# Patient Record
Sex: Male | Born: 1940 | Race: Asian | Hispanic: No | Marital: Married | Smoking: Former smoker
Health system: Southern US, Community
[De-identification: ages and names within clinical notes are randomized; demographics above are authoritative.]

## PROBLEM LIST (undated history)

## (undated) DIAGNOSIS — C801 Malignant (primary) neoplasm, unspecified: Secondary | ICD-10-CM

## (undated) DIAGNOSIS — E785 Hyperlipidemia, unspecified: Secondary | ICD-10-CM

## (undated) DIAGNOSIS — Z87442 Personal history of urinary calculi: Secondary | ICD-10-CM

## (undated) DIAGNOSIS — N189 Chronic kidney disease, unspecified: Secondary | ICD-10-CM

## (undated) DIAGNOSIS — I499 Cardiac arrhythmia, unspecified: Secondary | ICD-10-CM

## (undated) DIAGNOSIS — I10 Essential (primary) hypertension: Secondary | ICD-10-CM

## (undated) DIAGNOSIS — C61 Malignant neoplasm of prostate: Secondary | ICD-10-CM

## (undated) DIAGNOSIS — I4891 Unspecified atrial fibrillation: Secondary | ICD-10-CM

## (undated) DIAGNOSIS — M109 Gout, unspecified: Secondary | ICD-10-CM

## (undated) HISTORY — PX: HEMORROIDECTOMY: SUR656

## (undated) HISTORY — PX: COLONOSCOPY: SHX174

## (undated) HISTORY — PX: INSERTION PROSTATE RADIATION SEED: SUR718

## (undated) HISTORY — PX: VASECTOMY: SHX75

## (undated) HISTORY — PX: LITHOTRIPSY: SUR834

## (undated) NOTE — *Deleted (*Deleted)
Physician Discharge Summary  Ross Mccann WUJ:[id number] DOB: 01-22-1941 DOA: 03/17/2020  PCP: Barbette Reichmann, MD  Admit date: 03/17/2020 Discharge date: 03/18/2020  Admitted From: *** Disposition:  ***  Recommendations for Outpatient Follow-up:  1. Follow up with PCP in 1-2 weeks 2. Please obtain BMP/CBC in one week 3. Please follow up with your PCP on the following pending results: Unresulted Labs (From admission, onward)          Start     Ordered   03/19/20 0500  CBC  Tomorrow morning,   STAT        03/18/20 0705   03/19/20 0500  Basic metabolic panel  Tomorrow morning,   STAT        03/18/20 0705   03/19/20 0500  Magnesium  Tomorrow morning,   STAT        03/18/20 0705           Home Health:***  Equipment/Devices:***   Discharge Condition:***  CODE STATUS:***  Diet recommendation: ***  Subjective:***  Brief/Interim Summary: ***  Discharge Diagnoses:  Principal Problem:   Severe aortic stenosis Active Problems:   Chronic atrial fibrillation (HCC)   Chronic diastolic CHF (congestive heart failure) (HCC)   Hypertension   Hyperlipidemia, unspecified   Chronic anticoagulation - on Eliquis for afib    Discharge Instructions   Allergies as of 03/18/2020   No Known Allergies   Med Rec must be completed prior to using this SMARTLINK***       No Known Allergies  Consultations: ***   Procedures/Studies: DG Chest 2 View  Result Date: 03/17/2020 CLINICAL DATA:  61 year old male with shortness of breath EXAM: CHEST - 2 VIEW COMPARISON:  Chest radiograph dated 06/10/2018. FINDINGS: Diffuse interstitial coarsening and mild chronic bronchitic changes. No focal consolidation, pleural effusion or pneumothorax. Top-normal cardiac silhouette. Atherosclerotic calcification of the aorta. No acute osseous pathology. IMPRESSION: No active cardiopulmonary disease. Electronically Signed   By: Elgie Collard M.D.   On: 03/17/2020 16:05      Discharge  Exam: Vitals:   03/18/20 0737 03/18/20 0919  BP: (!) 129/107 127/90  Pulse: 62 (!) 114  Resp: 15 20  Temp: (!) 97.3 F (36.3 C)   SpO2: 98% 96%   Vitals:   03/18/20 0530 03/18/20 0600 03/18/20 0737 03/18/20 0919  BP: (!) 118/93 (!) 111/99 (!) 129/107 127/90  Pulse: 64 (!) 30 62 (!) 114  Resp: 11 16 15 20   Temp:   (!) 97.3 F (36.3 C)   TempSrc:   Oral   SpO2: 97% 94% 98% 96%  Weight:      Height:        General: Pt is alert, awake, not in acute distress Cardiovascular: RRR, S1/S2 +, no rubs, no gallops Respiratory: CTA bilaterally, no wheezing, no rhonchi Abdominal: Soft, NT, ND, bowel sounds + Extremities: no edema, no cyanosis    The results of significant diagnostics from this hospitalization (including imaging, microbiology, ancillary and laboratory) are listed below for reference.     Microbiology: Recent Results (from the past 240 hour(s))  Resp Panel by RT-PCR (Flu A&B, Covid) Nasopharyngeal Swab     Status: None   Collection Time: 03/17/20  8:31 PM   Specimen: Nasopharyngeal Swab; Nasopharyngeal(NP) swabs in vial transport medium  Result Value Ref Range Status   SARS Coronavirus 2 by RT PCR NEGATIVE NEGATIVE Final    Comment: (NOTE) SARS-CoV-2 target nucleic acids are NOT DETECTED.  The SARS-CoV-2 RNA is generally detectable in upper respiratory  specimens during the acute phase of infection. The lowest concentration of SARS-CoV-2 viral copies this assay can detect is 138 copies/mL. A negative result does not preclude SARS-Cov-2 infection and should not be used as the sole basis for treatment or other patient management decisions. A negative result may occur with  improper specimen collection/handling, submission of specimen other than nasopharyngeal swab, presence of viral mutation(s) within the areas targeted by this assay, and inadequate number of viral copies(<138 copies/mL). A negative result must be combined with clinical observations, patient  history, and epidemiological information. The expected result is Negative.  Fact Sheet for Patients:  BloggerCourse.com  Fact Sheet for Healthcare Providers:  SeriousBroker.it  This test is no t yet approved or cleared by the [place] FDA and  has been authorized for detection and/or diagnosis of SARS-CoV-2 by FDA under an Emergency Use Authorization (EUA). This EUA will remain  in effect (meaning this test can be used) for the duration of the COVID-19 declaration under Section 564(b)(1) of the Act, 21 U.S.C.section 360bbb-3(b)(1), unless the authorization is terminated  or revoked sooner.       Influenza A by PCR NEGATIVE NEGATIVE Final   Influenza B by PCR NEGATIVE NEGATIVE Final    Comment: (NOTE) The Xpert Xpress SARS-CoV-2/FLU/RSV plus assay is intended as an aid in the diagnosis of influenza from Nasopharyngeal swab specimens and should not be used as a sole basis for treatment. Nasal washings and aspirates are unacceptable for Xpert Xpress SARS-CoV-2/FLU/RSV testing.  Fact Sheet for Patients: BloggerCourse.com  Fact Sheet for Healthcare Providers: SeriousBroker.it  This test is not yet approved or cleared by the [place] FDA and has been authorized for detection and/or diagnosis of SARS-CoV-2 by FDA under an Emergency Use Authorization (EUA). This EUA will remain in effect (meaning this test can be used) for the duration of the COVID-19 declaration under Section 564(b)(1) of the Act, 21 U.S.C. section 360bbb-3(b)(1), unless the authorization is terminated or revoked.  Performed at [facility], [address] [place], [place] [address]      Labs: BNP (last 3 results) Recent Labs    03/17/20 2031  BNP 2,997.3*   Basic Metabolic Panel: Recent Labs  Lab 03/17/20 1535 03/18/20 0452  NA 137 140  K 4.4 4.7  CL 103 105  CO2 23 24   GLUCOSE 118* 113*  BUN 47* 49*  CREATININE 1.69* 1.78*  CALCIUM 9.5 9.6   Liver Function Tests: Recent Labs  Lab 03/17/20 2031 03/18/20 0452  AST 46* 41  ALT 31 31  ALKPHOS 104 93  BILITOT 2.1* 2.0*  PROT 7.4 7.1  ALBUMIN 4.1 3.7   No results for input(s): LIPASE, AMYLASE in the last 168 hours. No results for input(s): AMMONIA in the last 168 hours. CBC: Recent Labs  Lab 03/17/20 1535 03/18/20 0452  WBC 6.6 6.8  NEUTROABS  --  4.4  HGB 14.0 13.1  HCT 45.8 41.0  MCV 68.9* 68.2*  PLT 187 168   Cardiac Enzymes: No results for input(s): CKTOTAL, CKMB, CKMBINDEX, TROPONINI in the last 168 hours. BNP: Invalid input(s): POCBNP CBG: No results for input(s): GLUCAP in the last 168 hours. D-Dimer Recent Labs    03/17/20 2031  DDIMER 2.10*   Hgb A1c No results for input(s): HGBA1C in the last 72 hours. Lipid Profile No results for input(s): CHOL, HDL, LDLCALC, TRIG, CHOLHDL, LDLDIRECT in the last 72 hours. Thyroid function studies No results for input(s): TSH, T4TOTAL, T3FREE, THYROIDAB in the last 72  hours.  Invalid input(s): FREET3 Anemia work up No results for input(s): VITAMINB12, FOLATE, FERRITIN, TIBC, IRON, RETICCTPCT in the last 72 hours. Urinalysis    Component Value Date/Time   COLORURINE YELLOW (A) 03/17/2020 1535   APPEARANCEUR HAZY (A) 03/17/2020 1535   LABSPEC 1.019 03/17/2020 1535   PHURINE 5.0 03/17/2020 1535   GLUCOSEU NEGATIVE 03/17/2020 1535   HGBUR LARGE (A) 03/17/2020 1535   BILIRUBINUR NEGATIVE 03/17/2020 1535   KETONESUR NEGATIVE 03/17/2020 1535   PROTEINUR >=300 (A) 03/17/2020 1535   NITRITE NEGATIVE 03/17/2020 1535   LEUKOCYTESUR NEGATIVE 03/17/2020 1535   Sepsis Labs Invalid input(s): PROCALCITONIN,  WBC,  LACTICIDVEN Microbiology Recent Results (from the past 240 hour(s))  Resp Panel by RT-PCR (Flu A&B, Covid) Nasopharyngeal Swab     Status: None   Collection Time: 03/17/20  8:31 PM   Specimen: Nasopharyngeal Swab;  Nasopharyngeal(NP) swabs in vial transport medium  Result Value Ref Range Status   SARS Coronavirus 2 by RT PCR NEGATIVE NEGATIVE Final    Comment: (NOTE) SARS-CoV-2 target nucleic acids are NOT DETECTED.  The SARS-CoV-2 RNA is generally detectable in upper respiratory specimens during the acute phase of infection. The lowest concentration of SARS-CoV-2 viral copies this assay can detect is 138 copies/mL. A negative result does not preclude SARS-Cov-2 infection and should not be used as the sole basis for treatment or other patient management decisions. A negative result may occur with  improper specimen collection/handling, submission of specimen other than nasopharyngeal swab, presence of viral mutation(s) within the areas targeted by this assay, and inadequate number of viral copies(<138 copies/mL). A negative result must be combined with clinical observations, patient history, and epidemiological information. The expected result is Negative.  Fact Sheet for Patients:  BloggerCourse.com  Fact Sheet for Healthcare Providers:  SeriousBroker.it  This test is no t yet approved or cleared by the [place] FDA and  has been authorized for detection and/or diagnosis of SARS-CoV-2 by FDA under an Emergency Use Authorization (EUA). This EUA will remain  in effect (meaning this test can be used) for the duration of the COVID-19 declaration under Section 564(b)(1) of the Act, 21 U.S.C.section 360bbb-3(b)(1), unless the authorization is terminated  or revoked sooner.       Influenza A by PCR NEGATIVE NEGATIVE Final   Influenza B by PCR NEGATIVE NEGATIVE Final    Comment: (NOTE) The Xpert Xpress SARS-CoV-2/FLU/RSV plus assay is intended as an aid in the diagnosis of influenza from Nasopharyngeal swab specimens and should not be used as a sole basis for treatment. Nasal washings and aspirates are unacceptable for Xpert Xpress  SARS-CoV-2/FLU/RSV testing.  Fact Sheet for Patients: BloggerCourse.com  Fact Sheet for Healthcare Providers: SeriousBroker.it  This test is not yet approved or cleared by the [place] FDA and has been authorized for detection and/or diagnosis of SARS-CoV-2 by FDA under an Emergency Use Authorization (EUA). This EUA will remain in effect (meaning this test can be used) for the duration of the COVID-19 declaration under Section 564(b)(1) of the Act, 21 U.S.C. section 360bbb-3(b)(1), unless the authorization is terminated or revoked.  Performed at [facility], [address], [place], [place] [address]      Time coordinating discharge: Over 30 minutes  SIGNED:   Hughie Closs, MD  [facility] 03/18/2020, 9:56 AM  If 7PM-7AM, please contact night-coverage [email or web address]

---

## 2005-12-31 ENCOUNTER — Emergency Department: Payer: Self-pay | Admitting: Emergency Medicine

## 2006-01-03 ENCOUNTER — Ambulatory Visit: Payer: Self-pay | Admitting: Urology

## 2006-01-09 ENCOUNTER — Emergency Department: Payer: Self-pay | Admitting: Emergency Medicine

## 2006-01-14 ENCOUNTER — Emergency Department: Payer: Self-pay | Admitting: Emergency Medicine

## 2006-01-15 ENCOUNTER — Emergency Department: Payer: Self-pay | Admitting: Emergency Medicine

## 2006-10-18 ENCOUNTER — Other Ambulatory Visit: Payer: Self-pay

## 2006-10-18 ENCOUNTER — Ambulatory Visit: Payer: Self-pay | Admitting: Urology

## 2006-10-24 ENCOUNTER — Ambulatory Visit: Payer: Self-pay | Admitting: Urology

## 2007-06-05 ENCOUNTER — Ambulatory Visit: Payer: Self-pay | Admitting: Internal Medicine

## 2008-10-12 ENCOUNTER — Emergency Department: Payer: Self-pay | Admitting: Emergency Medicine

## 2009-03-17 ENCOUNTER — Emergency Department: Payer: Self-pay | Admitting: Emergency Medicine

## 2009-03-26 ENCOUNTER — Ambulatory Visit: Payer: Self-pay | Admitting: Urology

## 2009-03-31 ENCOUNTER — Ambulatory Visit: Payer: Self-pay | Admitting: Urology

## 2012-12-21 ENCOUNTER — Ambulatory Visit: Payer: Self-pay | Admitting: Urology

## 2012-12-22 ENCOUNTER — Ambulatory Visit: Payer: Self-pay | Admitting: Urology

## 2012-12-27 ENCOUNTER — Ambulatory Visit: Payer: Self-pay | Admitting: Hematology and Oncology

## 2012-12-27 LAB — COMPREHENSIVE METABOLIC PANEL
Albumin: 3.9 g/dL (ref 3.4–5.0)
Alkaline Phosphatase: 88 U/L (ref 50–136)
Anion Gap: 6 — ABNORMAL LOW (ref 7–16)
BUN: 23 mg/dL — ABNORMAL HIGH (ref 7–18)
Calcium, Total: 9.7 mg/dL (ref 8.5–10.1)
Co2: 30 mmol/L (ref 21–32)
Creatinine: 1.32 mg/dL — ABNORMAL HIGH (ref 0.60–1.30)
Osmolality: 279 (ref 275–301)
SGPT (ALT): 48 U/L (ref 12–78)
Sodium: 138 mmol/L (ref 136–145)

## 2012-12-27 LAB — CBC CANCER CENTER
Eosinophil %: 2.4 %
HGB: 13.5 g/dL (ref 13.0–18.0)
Lymphocyte #: 1.9 x10 3/mm (ref 1.0–3.6)
Lymphocyte %: 39.7 %
MCV: 67 fL — ABNORMAL LOW (ref 80–100)
RBC: 6.41 10*6/uL — ABNORMAL HIGH (ref 4.40–5.90)
RDW: 16.3 % — ABNORMAL HIGH (ref 11.5–14.5)
WBC: 4.9 x10 3/mm (ref 3.8–10.6)

## 2012-12-29 LAB — PROT IMMUNOELECTROPHORES(ARMC)

## 2013-01-17 ENCOUNTER — Ambulatory Visit: Payer: Self-pay | Admitting: Hematology and Oncology

## 2013-02-02 LAB — PSA: PSA: 1.2 ng/mL (ref 0.0–4.0)

## 2013-02-17 ENCOUNTER — Ambulatory Visit: Payer: Self-pay | Admitting: Hematology and Oncology

## 2013-04-26 ENCOUNTER — Ambulatory Visit: Payer: Self-pay | Admitting: Hematology and Oncology

## 2013-04-26 LAB — CBC CANCER CENTER
BASOS ABS: 0 x10 3/mm (ref 0.0–0.1)
Basophil %: 0.5 %
Eosinophil #: 0.1 x10 3/mm (ref 0.0–0.7)
Eosinophil %: 1.5 %
HCT: 42.1 % (ref 40.0–52.0)
HGB: 13 g/dL (ref 13.0–18.0)
LYMPHS PCT: 29.6 %
Lymphocyte #: 2.1 x10 3/mm (ref 1.0–3.6)
MCH: 20.8 pg — ABNORMAL LOW (ref 26.0–34.0)
MCHC: 30.8 g/dL — ABNORMAL LOW (ref 32.0–36.0)
MCV: 68 fL — AB (ref 80–100)
MONOS PCT: 7.5 %
Monocyte #: 0.5 x10 3/mm (ref 0.2–1.0)
NEUTROS PCT: 60.9 %
Neutrophil #: 4.4 x10 3/mm (ref 1.4–6.5)
PLATELETS: 197 x10 3/mm (ref 150–440)
RBC: 6.23 10*6/uL — AB (ref 4.40–5.90)
RDW: 16.4 % — AB (ref 11.5–14.5)
WBC: 7.2 x10 3/mm (ref 3.8–10.6)

## 2013-04-26 LAB — COMPREHENSIVE METABOLIC PANEL
ANION GAP: 8 (ref 7–16)
AST: 25 U/L (ref 15–37)
Albumin: 4 g/dL (ref 3.4–5.0)
Alkaline Phosphatase: 78 U/L
BILIRUBIN TOTAL: 0.5 mg/dL (ref 0.2–1.0)
BUN: 21 mg/dL — ABNORMAL HIGH (ref 7–18)
CHLORIDE: 101 mmol/L (ref 98–107)
Calcium, Total: 9.3 mg/dL (ref 8.5–10.1)
Co2: 28 mmol/L (ref 21–32)
Creatinine: 1.49 mg/dL — ABNORMAL HIGH (ref 0.60–1.30)
EGFR (African American): 53 — ABNORMAL LOW
GFR CALC NON AF AMER: 46 — AB
GLUCOSE: 105 mg/dL — AB (ref 65–99)
Osmolality: 277 (ref 275–301)
Potassium: 4.4 mmol/L (ref 3.5–5.1)
SGPT (ALT): 30 U/L (ref 12–78)
SODIUM: 137 mmol/L (ref 136–145)
Total Protein: 7.5 g/dL (ref 6.4–8.2)

## 2013-04-28 LAB — PSA: PSA: 1.6 ng/mL (ref 0.0–4.0)

## 2013-05-20 ENCOUNTER — Ambulatory Visit: Payer: Self-pay | Admitting: Hematology and Oncology

## 2013-08-23 ENCOUNTER — Ambulatory Visit: Payer: Self-pay | Admitting: Hematology and Oncology

## 2013-08-24 LAB — COMPREHENSIVE METABOLIC PANEL
ANION GAP: 5 — AB (ref 7–16)
AST: 43 U/L — AB (ref 15–37)
Albumin: 3.9 g/dL (ref 3.4–5.0)
Alkaline Phosphatase: 85 U/L
BILIRUBIN TOTAL: 0.6 mg/dL (ref 0.2–1.0)
BUN: 37 mg/dL — AB (ref 7–18)
CHLORIDE: 105 mmol/L (ref 98–107)
Calcium, Total: 9 mg/dL (ref 8.5–10.1)
Co2: 26 mmol/L (ref 21–32)
Creatinine: 1.43 mg/dL — ABNORMAL HIGH (ref 0.60–1.30)
EGFR (African American): 55 — ABNORMAL LOW
EGFR (Non-African Amer.): 48 — ABNORMAL LOW
Glucose: 122 mg/dL — ABNORMAL HIGH (ref 65–99)
OSMOLALITY: 282 (ref 275–301)
Potassium: 4.1 mmol/L (ref 3.5–5.1)
SGPT (ALT): 35 U/L (ref 12–78)
SODIUM: 136 mmol/L (ref 136–145)
Total Protein: 7.6 g/dL (ref 6.4–8.2)

## 2013-08-24 LAB — CBC CANCER CENTER
BASOS ABS: 0.1 x10 3/mm (ref 0.0–0.1)
Basophil %: 0.7 %
Eosinophil #: 0.1 x10 3/mm (ref 0.0–0.7)
Eosinophil %: 0.9 %
HCT: 41.4 % (ref 40.0–52.0)
HGB: 13 g/dL (ref 13.0–18.0)
LYMPHS ABS: 1.9 x10 3/mm (ref 1.0–3.6)
LYMPHS PCT: 24.2 %
MCH: 21.3 pg — ABNORMAL LOW (ref 26.0–34.0)
MCHC: 31.3 g/dL — ABNORMAL LOW (ref 32.0–36.0)
MCV: 68 fL — ABNORMAL LOW (ref 80–100)
Monocyte #: 0.5 x10 3/mm (ref 0.2–1.0)
Monocyte %: 6.7 %
NEUTROS PCT: 67.5 %
Neutrophil #: 5.4 x10 3/mm (ref 1.4–6.5)
PLATELETS: 168 x10 3/mm (ref 150–440)
RBC: 6.09 10*6/uL — ABNORMAL HIGH (ref 4.40–5.90)
RDW: 15.8 % — ABNORMAL HIGH (ref 11.5–14.5)
WBC: 7.9 x10 3/mm (ref 3.8–10.6)

## 2013-08-28 LAB — PSA: PSA: 1.9 ng/mL (ref 0.0–4.0)

## 2013-09-17 ENCOUNTER — Ambulatory Visit: Payer: Self-pay | Admitting: Hematology and Oncology

## 2015-08-07 ENCOUNTER — Other Ambulatory Visit: Payer: Self-pay | Admitting: Urology

## 2015-08-07 DIAGNOSIS — C61 Malignant neoplasm of prostate: Secondary | ICD-10-CM

## 2015-08-19 ENCOUNTER — Ambulatory Visit
Admission: RE | Admit: 2015-08-19 | Discharge: 2015-08-19 | Disposition: A | Discharge status: Home / Self Care | Payer: Medicare Other | Source: Ambulatory Visit | Attending: Urology | Admitting: Urology

## 2015-08-19 ENCOUNTER — Encounter
Admission: RE | Admit: 2015-08-19 | Discharge: 2015-08-19 | Disposition: A | Discharge status: Home / Self Care | Payer: Medicare Other | Source: Ambulatory Visit | Attending: Urology | Admitting: Urology

## 2015-08-19 DIAGNOSIS — C61 Malignant neoplasm of prostate: Secondary | ICD-10-CM | POA: Diagnosis not present

## 2015-08-19 DIAGNOSIS — I2510 Atherosclerotic heart disease of native coronary artery without angina pectoris: Secondary | ICD-10-CM | POA: Diagnosis not present

## 2015-08-19 DIAGNOSIS — R937 Abnormal findings on diagnostic imaging of other parts of musculoskeletal system: Secondary | ICD-10-CM | POA: Insufficient documentation

## 2015-08-19 DIAGNOSIS — I7090 Unspecified atherosclerosis: Secondary | ICD-10-CM | POA: Diagnosis not present

## 2015-08-19 DIAGNOSIS — R972 Elevated prostate specific antigen [PSA]: Secondary | ICD-10-CM | POA: Insufficient documentation

## 2015-08-19 DIAGNOSIS — N210 Calculus in bladder: Secondary | ICD-10-CM | POA: Insufficient documentation

## 2015-08-19 HISTORY — DX: Malignant (primary) neoplasm, unspecified: C80.1

## 2015-08-19 LAB — POCT I-STAT CREATININE: Creatinine, Ser: 1.1 mg/dL (ref 0.61–1.24)

## 2015-08-19 MED ORDER — IOPAMIDOL (ISOVUE-300) INJECTION 61%
100.0000 mL | Freq: Once | INTRAVENOUS | Status: AC | PRN
  Administered 2015-08-19: 100 mL via INTRAVENOUS

## 2015-08-19 MED ORDER — TECHNETIUM TC 99M MEDRONATE IV KIT
25.0000 | PACK | Freq: Once | INTRAVENOUS | Status: AC | PRN
  Administered 2015-08-19: 23.42 via INTRAVENOUS

## 2015-08-25 ENCOUNTER — Other Ambulatory Visit: Payer: Self-pay | Admitting: Urology

## 2015-08-25 ENCOUNTER — Other Ambulatory Visit: Payer: Self-pay | Admitting: Orthopedic Surgery

## 2015-08-25 DIAGNOSIS — C419 Malignant neoplasm of bone and articular cartilage, unspecified: Secondary | ICD-10-CM

## 2015-08-27 ENCOUNTER — Ambulatory Visit
Admission: RE | Admit: 2015-08-27 | Discharge: 2015-08-27 | Disposition: A | Discharge status: Home / Self Care | Payer: Medicare Other | Source: Ambulatory Visit | Attending: Urology | Admitting: Urology

## 2015-08-27 DIAGNOSIS — C419 Malignant neoplasm of bone and articular cartilage, unspecified: Secondary | ICD-10-CM

## 2015-08-27 DIAGNOSIS — C61 Malignant neoplasm of prostate: Secondary | ICD-10-CM | POA: Diagnosis present

## 2015-09-04 DIAGNOSIS — I482 Chronic atrial fibrillation, unspecified: Secondary | ICD-10-CM | POA: Diagnosis present

## 2015-09-18 ENCOUNTER — Encounter: Payer: Self-pay | Admitting: Emergency Medicine

## 2015-09-18 ENCOUNTER — Emergency Department
Admission: EM | Admit: 2015-09-18 | Discharge: 2015-09-18 | Disposition: A | Discharge status: Home / Self Care | Payer: Medicare Other | Attending: Emergency Medicine | Admitting: Emergency Medicine

## 2015-09-18 DIAGNOSIS — Z87891 Personal history of nicotine dependence: Secondary | ICD-10-CM | POA: Insufficient documentation

## 2015-09-18 DIAGNOSIS — E785 Hyperlipidemia, unspecified: Secondary | ICD-10-CM | POA: Insufficient documentation

## 2015-09-18 DIAGNOSIS — I10 Essential (primary) hypertension: Secondary | ICD-10-CM | POA: Insufficient documentation

## 2015-09-18 DIAGNOSIS — R339 Retention of urine, unspecified: Secondary | ICD-10-CM | POA: Diagnosis not present

## 2015-09-18 DIAGNOSIS — Z8546 Personal history of malignant neoplasm of prostate: Secondary | ICD-10-CM | POA: Diagnosis not present

## 2015-09-18 DIAGNOSIS — I4891 Unspecified atrial fibrillation: Secondary | ICD-10-CM | POA: Insufficient documentation

## 2015-09-18 DIAGNOSIS — R338 Other retention of urine: Secondary | ICD-10-CM

## 2015-09-18 HISTORY — DX: Malignant neoplasm of prostate: C61

## 2015-09-18 HISTORY — DX: Hyperlipidemia, unspecified: E78.5

## 2015-09-18 HISTORY — DX: Unspecified atrial fibrillation: I48.91

## 2015-09-18 HISTORY — DX: Essential (primary) hypertension: I10

## 2015-09-18 LAB — URINALYSIS COMPLETE WITH MICROSCOPIC (ARMC ONLY)
BILIRUBIN URINE: NEGATIVE
Bacteria, UA: NONE SEEN
GLUCOSE, UA: NEGATIVE mg/dL
Ketones, ur: NEGATIVE mg/dL
Leukocytes, UA: NEGATIVE
Nitrite: NEGATIVE
Protein, ur: NEGATIVE mg/dL
RBC / HPF: NONE SEEN RBC/hpf (ref 0–5)
SPECIFIC GRAVITY, URINE: 1.003 — AB (ref 1.005–1.030)
Squamous Epithelial / LPF: NONE SEEN
pH: 6 (ref 5.0–8.0)

## 2015-09-18 MED ORDER — LIDOCAINE HCL 2 % EX GEL
CUTANEOUS | Status: AC
  Filled 2015-09-18: qty 10

## 2015-09-18 NOTE — ED Notes (Signed)
Pt discharged home after verbalizing understanding of discharge instructions; nad noted. 

## 2015-09-18 NOTE — ED Notes (Signed)
Pt presents with urinary retention since this morning. He had normal urinary function yesterday, but has been unable to urinate other than very small amounts today. States he has a constant urge to urinate and then can't. Pt alert & oriented; denies pain.

## 2015-09-18 NOTE — Discharge Instructions (Signed)
You were seen in the Emergency Department (ED) for urinary retention.  We placed a Foley catheter to help your bladder drain.  Please read through the included information and follow up with your doctor as recommended in these papers; your doctor will see you in clinic and help you determine when it is time to have the catheter removed.  If you stop producing urine in the bag or if you develop other symptoms that concern you, such as fever, chills, persistent vomiting, or severe abdominal pain, please return immediately to the Emergency Department.     Acute Urinary Retention, Male Acute urinary retention is the temporary inability to urinate. This is a common problem in older men. As men age their prostates become larger and block the flow of urine from the bladder. This is usually a problem that has come on gradually.  HOME CARE INSTRUCTIONS If you are sent home with a Foley catheter and a drainage system, you will need to discuss the best course of action with your health care provider. While the catheter is in, maintain a good intake of fluids. Keep the drainage bag emptied and lower than your catheter. This is so that contaminated urine will not flow back into your bladder, which could lead to a urinary tract infection. There are two main types of drainage bags. One is a large bag that usually is used at night. It has a good capacity that will allow you to sleep through the night without having to empty it. The second type is called a leg bag. It has a smaller capacity, so it needs to be emptied more frequently. However, the main advantage is that it can be attached by a leg strap and can go underneath your clothing, allowing you the freedom to move about or leave your home. Only take over-the-counter or prescription medicines for pain, discomfort, or fever as directed by your health care provider.  SEEK MEDICAL CARE IF:  You develop a low-grade fever.  You experience spasms or leakage of urine  with the spasms. SEEK IMMEDIATE MEDICAL CARE IF:   You develop chills or fever.  Your catheter stops draining urine.  Your catheter falls out.  You start to develop increased bleeding that does not respond to rest and increased fluid intake. MAKE SURE YOU:  Understand these instructions.  Will watch your condition.  Will get help right away if you are not doing well or get worse.   This information is not intended to replace advice given to you by your health care provider. Make sure you discuss any questions you have with your health care provider.   Document Released: 07/12/2000 Document Revised: 08/20/2014 Document Reviewed: 09/14/2012 Elsevier Interactive Patient Education 2016 [place], Adult  A Foley catheter is a soft, flexible tube that is placed into the bladder to drain urine. A Foley catheter may be inserted if:  You leak urine or are not able to control when you urinate (urinary incontinence).  You are not able to urinate when you need to (urinary retention).  You had prostate surgery or surgery on the genitals.  You have certain medical conditions, such as multiple sclerosis, dementia, or a spinal cord injury. If you are going home with a Foley catheter in place, follow the instructions below.  TAKING CARE OF THE CATHETER  1. Wash your hands with soap and water. 2. Using mild soap and warm water on a clean washcloth: Clean the area on your body closest  to the catheter insertion site using a circular motion, moving away from the catheter. Never wipe toward the catheter because this could sweep bacteria up into the urethra and cause infection.  Remove all traces of soap. Pat the area dry with a clean towel. For males, reposition the foreskin. 3. Attach the catheter to your leg so there is no tension on the catheter. Use adhesive tape or a leg strap. If you are using adhesive tape, remove any sticky residue left behind by the previous tape you  used. 4. Keep the drainage bag below the level of the bladder, but keep it off the floor. 5. Check throughout the day to be sure the catheter is working and urine is draining freely. Make sure the tubing does not become kinked. 6. Do not pull on the catheter or try to remove it. Pulling could damage internal tissues. TAKING CARE OF THE DRAINAGE BAGS  You will be given two drainage bags to take home. One is a large overnight drainage bag, and the other is a smaller leg bag that fits underneath clothing. You may wear the overnight bag at any time, but you should never wear the smaller leg bag at night. Follow the instructions below for how to empty, change, and clean your drainage bags.  Emptying the Drainage Bag  You must empty your drainage bag when it is - full or at least 2-3 times a day.  1. Wash your hands with soap and water. 2. Keep the drainage bag below your hips, below the level of your bladder. This stops urine from going back into the tubing and into your bladder. 3. Hold the dirty bag over the toilet or a clean container. 4. Open the pour spout at the bottom of the bag and empty the urine into the toilet or container. Do not let the pour spout touch the toilet, container, or any other surface. Doing so can place bacteria on the bag, which can cause an infection. 5. Clean the pour spout with a gauze pad or cotton ball that has rubbing alcohol on it. 6. Close the pour spout. 7. Attach the bag to your leg with adhesive tape or a leg strap. 8. Wash your hands well. Changing the Drainage Bag  Change your drainage bag once a month or sooner if it starts to smell bad or look dirty. Below are steps to follow when changing the drainage bag.  1. Wash your hands with soap and water. 2. Pinch off the rubber catheter so that urine does not spill out. 3. Disconnect the catheter tube from the drainage tube at the connection valve. Do not let the tubes touch any surface. 4. Clean the end of the  catheter tube with an alcohol wipe. Use a different alcohol wipe to clean the end of the drainage tube. 5. Connect the catheter tube to the drainage tube of the clean drainage bag. 6. Attach the new bag to the leg with adhesive tape or a leg strap. Avoid attaching the new bag too tightly. 7. Wash your hands well. Cleaning the Drainage Bag  1. Wash your hands with soap and water. 2. Wash the bag in warm, soapy water. 3. Rinse the bag thoroughly with warm water. 4. Fill the bag with a solution of white vinegar and water (1 cup vinegar to 1 qt warm water [.2 L vinegar to 1 L warm water]). Close the bag and soak it for 30 minutes in the solution. 5. Rinse the bag with warm water.  6. Hang the bag to dry with the pour spout open and hanging downward. 7. Store the clean bag (once it is dry) in a clean plastic bag. 8. Wash your hands well. PREVENTING INFECTION  Wash your hands before and after handling your catheter.  Take showers daily and wash the area where the catheter enters your body. Do not take baths. Replace wet leg straps with dry ones, if this applies.  Do not use powders, sprays, or lotions on the genital area. Only use creams, lotions, or ointments as directed by your caregiver.  For females, wipe from front to back after each bowel movement.  Drink enough fluids to keep your urine clear or pale yellow unless you have a fluid restriction.  Do not let the drainage bag or tubing touch or lie on the floor.  Wear cotton underwear to absorb moisture and to keep your skin drier. SEEK MEDICAL CARE IF:  Your urine is cloudy or smells unusually bad.  Your catheter becomes clogged.  You are not draining urine into the bag or your bladder feels full.  Your catheter starts to leak. SEEK IMMEDIATE MEDICAL CARE IF:  You have pain, swelling, redness, or pus where the catheter enters the body.  You have pain in the abdomen, legs, lower back, or bladder.  You have a fever.  You see blood fill the  catheter, or your urine is pink or red.  You have nausea, vomiting, or chills.  Your catheter gets pulled out. MAKE SURE YOU:  Understand these instructions.  Will watch your condition.  Will get help right away if you are not doing well or get worse. This information is not intended to replace advice given to you by your health care provider. Make sure you discuss any questions you have with your health care provider.  Document Released: 04/05/2005 Document Revised: 08/20/2013 Document Reviewed: 03/27/2012  Elsevier Interactive Patient Education [employer].

## 2015-09-18 NOTE — ED Notes (Signed)
Pt woke up this morning with sensation to urinate but states only a few drops came out.  Pt states he drank multiple glasses of water, continues to have urge to urinate frequently, but "nothing comes out". Pt states he has history of prostate cancer.

## 2015-09-18 NOTE — ED Provider Notes (Signed)
[facility] Emergency Department Provider Note  ____________________________________________  Time seen: Approximately 2:39 PM  I have reviewed the triage vital signs and the nursing notes.   HISTORY  Chief Complaint Urinary Retention    HPI Ross Mccann is a 75 y.o. male with history of prostate cancer and who is followed by Dr. Yves Dill with urology.  He presents to the emergency department complaining of urinary retention.  He reports that he is not been able to urinate since this morning.  His abdomen feels distended and he is feeling a dull aching pain that is severe throughout his lower abdomen.  He tries to urinate and only a few drops come out.  He has never had this issue in the past.  The symptoms are severe and nothing is helping.  It feels worse when he is moving around.  He denies fever/chills, chest pain, shortness of breath, nausea, vomiting, diarrhea.  He reports that he saw Dr. Yves Dill about 3-4 weeks ago and had some imaging performed to check on the status of the cancer as well as a prostate biopsy.  Dr. Eliberto Ivory told him that the biopsy was completely normal so there was no chance that he has cancer anymore, but he was never informed about the results of his imaging studies which included a bone scan and specific images of his head.   Past Medical History  Diagnosis Date  . Cancer [facility])     prostate  . Prostate cancer ([name])   . Hypertension   . Hyperlipidemia   . Atrial fibrillation ([place])     There are no active problems to display for this patient.   Past Surgical History  Procedure Laterality Date  . Vasectomy      No current outpatient prescriptions on file.  Allergies Review of patient's allergies indicates no known allergies.  No family history on file.  Social History Social History  Substance Use Topics  . Smoking status: Former Smoker    Quit date: 09/18/1978  . Smokeless tobacco: None  . Alcohol Use: 4.2 oz/week      7 Cans of beer per week    Review of Systems Constitutional: No fever/chills Eyes: No visual changes. ENT: No sore throat. Cardiovascular: Denies chest pain. Respiratory: Denies shortness of breath. Gastrointestinal: Lower abdominal swelling and pain due to urinary retention.  No nausea, no vomiting.  No diarrhea.  No constipation. Genitourinary: Inability to urinate with abdominal swelling Musculoskeletal: Negative for back pain. Skin: Negative for rash. Neurological: Negative for headaches, focal weakness or numbness.  10-point ROS otherwise negative.  ____________________________________________   PHYSICAL EXAM:  VITAL SIGNS: ED Triage Vitals  Enc Vitals Group     BP 09/18/15 1340 169/79 mmHg     Pulse Rate 09/18/15 1340 71     Resp 09/18/15 1340 16     Temp 09/18/15 1340 97.6 F (36.4 C)     Temp Source 09/18/15 1340 Oral     SpO2 09/18/15 1340 99 %     Weight 09/18/15 1340 154 lb (69.854 kg)     Height 09/18/15 1340 5\' 7"  (1.702 m)     Head Cir --      Peak Flow --      Pain Score --      Pain Loc --      Pain Edu? --      Excl. in [place]? --     Constitutional: Alert and oriented. Well appearing and in no acute distress. Eyes:  Conjunctivae are normal. PERRL. EOMI. Head: Atraumatic. Nose: No congestion/rhinnorhea. Mouth/Throat: Mucous membranes are moist.  Oropharynx non-erythematous. Neck: No stridor.  No meningeal signs.   Cardiovascular: Normal rate, regular rhythm. Good peripheral circulation. Grossly normal heart sounds.   Respiratory: Normal respiratory effort.  No retractions. Lungs CTAB. Gastrointestinal: Soft and Nontender (I examined the patient after a Foley catheter was placed and more than 700 mL of urine were drained). Musculoskeletal: No lower extremity tenderness nor edema. No gross deformities of extremities. Neurologic:  Normal speech and language. No gross focal neurologic deficits are appreciated.  Skin:  Skin is warm, dry and intact. No  rash noted. Psychiatric: Mood and affect are normal. Speech and behavior are normal.  ____________________________________________   LABS (all labs ordered are listed, but only abnormal results are displayed)  Labs Reviewed  URINALYSIS COMPLETEWITH MICROSCOPIC ([place]) - Abnormal; Notable for the following:    Color, Urine COLORLESS (*)    APPearance CLEAR (*)    Specific Gravity, Urine 1.003 (*)    Hgb urine dipstick 2+ (*)    All other components within normal limits  URINE CULTURE   ____________________________________________  EKG  None ____________________________________________  RADIOLOGY   No results found.  ____________________________________________   PROCEDURES  Procedure(s) performed: None  Critical Care performed: No ____________________________________________   INITIAL IMPRESSION / ASSESSMENT AND PLAN / ED COURSE  Pertinent labs & imaging results that were available during my care of the patient were reviewed by me and considered in my medical decision making (see chart for details).  The patient is in no distress at all and completely pain-free after placing the Foley catheter and draining about 700 mL of urine.  He reportedly recently started on Eliquis by his cardiologist but there is no evidence of gross hematuria.  There is no evidence of urinary tract infection.  I explained to him about leaving the Foley catheter in place with a leg bag and following up with Dr. Yves Dill within a week.  Other than the urinary retention, the patient is completely asymptomatic with normal vital signs.  Of note, I reviewed the images performed about 3 weeks ago, and they are concerning for evolving metastatic cancer.  However, given that Dr. Yves Dill knows the patient, knows the results of his biopsy, and is managing him, I did not feel it was appropriate for me to possibly inaccurately or inappropriately tell him that he may have metastatic cancer given that the  results are still preliminary from his bone imaging.  I did, however, encouraged him to follow up as soon as possible and ask Dr. Yves Dill about the results.  I also sent a message through [facility] to Dr. Yves Dill to let him know that the patient had been in the emergency department, needs follow-up for urinary retention, and also needs follow up about his imaging.     ____________________________________________  FINAL CLINICAL IMPRESSION(S) / ED DIAGNOSES  Final diagnoses:  Acute urinary retention  History of prostate cancer     MEDICATIONS GIVEN DURING THIS VISIT:  Medications  lidocaine (XYLOCAINE) 2 % jelly (not administered)     NEW OUTPATIENT MEDICATIONS STARTED DURING THIS VISIT:  New Prescriptions   No medications on file      Note:  This document was prepared using Dragon voice recognition software and may include unintentional dictation errors.   Hinda Kehr, MD 09/18/15 [phone]

## 2015-09-19 ENCOUNTER — Emergency Department
Admission: EM | Admit: 2015-09-19 | Discharge: 2015-09-19 | Disposition: A | Discharge status: Home / Self Care | Payer: Medicare Other | Attending: Emergency Medicine | Admitting: Emergency Medicine

## 2015-09-19 ENCOUNTER — Encounter: Payer: Self-pay | Admitting: Emergency Medicine

## 2015-09-19 DIAGNOSIS — T83038A Leakage of other indwelling urethral catheter, initial encounter: Secondary | ICD-10-CM | POA: Diagnosis present

## 2015-09-19 DIAGNOSIS — I4891 Unspecified atrial fibrillation: Secondary | ICD-10-CM | POA: Insufficient documentation

## 2015-09-19 DIAGNOSIS — Z87891 Personal history of nicotine dependence: Secondary | ICD-10-CM | POA: Diagnosis not present

## 2015-09-19 DIAGNOSIS — I10 Essential (primary) hypertension: Secondary | ICD-10-CM | POA: Insufficient documentation

## 2015-09-19 DIAGNOSIS — Y829 Unspecified medical devices associated with adverse incidents: Secondary | ICD-10-CM | POA: Diagnosis not present

## 2015-09-19 DIAGNOSIS — Z8546 Personal history of malignant neoplasm of prostate: Secondary | ICD-10-CM | POA: Diagnosis not present

## 2015-09-19 DIAGNOSIS — T839XXA Unspecified complication of genitourinary prosthetic device, implant and graft, initial encounter: Secondary | ICD-10-CM

## 2015-09-19 DIAGNOSIS — E785 Hyperlipidemia, unspecified: Secondary | ICD-10-CM | POA: Insufficient documentation

## 2015-09-19 NOTE — ED Notes (Signed)
Discussed discharge instructions and follow-up care with patient. No questions or concerns at this time. Pt stable at discharge.  

## 2015-09-19 NOTE — ED Notes (Signed)
Pt presents to ED with reports of catheter drainage problems. Pt states catheter has stopped draining urine today and is painful. Pt states catheter was placed yesterday here in the ED.

## 2015-09-19 NOTE — ED Notes (Signed)
Offered to change catheter; explained to patient that we do not have a larger size catheter than a 5F which is what he has currently.  Pt declines offer at this time; stating it not leaked since being flushed and that he plans to see his urologist next week.  MD notified.

## 2015-09-19 NOTE — ED Provider Notes (Addendum)
[facility] Emergency Department Provider Note  ____________________________________________   I have reviewed the triage vital signs and the nursing notes.   HISTORY  Chief Complaint Catheter leaking     HPI Ross Mccann is a 75 y.o. male who had a catheter placed yesterday. History of prostate cancer. Has had multiple catheters placed in the past. Does follow closely with Dr. Eliberto Ivory. Has had actually no pain he states but he has a sensation that he needs to urinate sometimes he has a little bit of leakage around the catheter itself. He states the catheter is still draining quite well actually now that he has been watching it. There is concern that perhaps it is not the right size catheter. He does not have any fever or flank pain vomiting or any other symptoms.     Past Medical History  Diagnosis Date  . Cancer [facility])     prostate  . Prostate cancer ([place])   . Hypertension   . Hyperlipidemia   . Atrial fibrillation ([place])     There are no active problems to display for this patient.   Past Surgical History  Procedure Laterality Date  . Vasectomy      No current outpatient prescriptions on file.  Allergies Review of patient's allergies indicates no known allergies.  No family history on file.  Social History Social History  Substance Use Topics  . Smoking status: Former Smoker    Quit date: 09/18/1978  . Smokeless tobacco: None  . Alcohol Use: 4.2 oz/week    7 Cans of beer per week    Review of Systems Constitutional: No fever/chills Eyes: No visual changes. ENT: No sore throat. No stiff neck no neck pain Cardiovascular: Denies chest pain. Respiratory: Denies shortness of breath. Gastrointestinal:   no vomiting.  No diarrhea.  No constipation. Genitourinary: Negative for dysuria. Musculoskeletal: Negative lower extremity swelling Skin: Negative for rash. Neurological: Negative for headaches, focal weakness or  numbness. 10-point ROS otherwise negative.  ____________________________________________   PHYSICAL EXAM:  VITAL SIGNS: ED Triage Vitals  Enc Vitals Group     BP 09/19/15 1739 145/87 mmHg     Pulse Rate 09/19/15 1739 53     Resp 09/19/15 1739 18     Temp 09/19/15 1739 97.5 F (36.4 C)     Temp src --      SpO2 09/19/15 1739 100 %     Weight 09/19/15 1739 154 lb (69.854 kg)     Height 09/19/15 1739 5\' 7"  (1.702 m)     Head Cir --      Peak Flow --      Pain Score 09/19/15 1811 0     Pain Loc --      Pain Edu? --      Excl. in [place]? --     Constitutional: Alert and oriented. Well appearing and in no acute distress. Eyes: Conjunctivae are normal. PERRL. EOMI. Head: Atraumatic. Nose: No congestion/rhinnorhea. Mouth/Throat: Mucous membranes are moist.  Oropharynx non-erythematous. Neck: No stridor.   Nontender with no meningismus Cardiovascular: Normal rate, regular rhythm. Grossly normal heart sounds.  Good peripheral circulation. Respiratory: Normal respiratory effort.  No retractions. Lungs CTAB. Abdominal: Soft and nontender. No distention. No guarding no rebound Back:  There is no focal tenderness or step off there is no midline tenderness there are no lesions noted. there is no CVA tenderness GU: Foley in place, no testicular pain or swelling no penile pain or swelling no redness, the Foley  is draining very well there is no significant amount of drainage around the Foley itself no purulence, urine is clear Musculoskeletal: No lower extremity tenderness. No joint effusions, no DVT signs strong distal pulses no edema Neurologic:  Normal speech and language. No gross focal neurologic deficits are appreciated.  Skin:  Skin is warm, dry and intact. No rash noted. Psychiatric: Mood and affect are normal. Speech and behavior are normal.  ____________________________________________   LABS (all labs ordered are listed, but only abnormal results are displayed)  Labs Reviewed -  No data to display ____________________________________________  EKG   ____________________________________________  RADIOLOGY  I reviewed any imaging ordered by me or triage that were performed during my shift and, if possible, patient and/or family made aware of any abnormal findings. ____________________________________________   PROCEDURES  Procedure(s) performed: None  Critical Care performed: None  ____________________________________________   INITIAL IMPRESSION / ASSESSMENT AND PLAN / ED COURSE  Pertinent labs & imaging results that were available during my care of the patient were reviewed by me and considered in my medical decision making (see chart for details).  Patient with concerns of drainage around the Foley. I have kept him here for little while to observe him. 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 I do not think that he have an 75 [place] that we can put in there and even if we did be reluctant to take out his Foley which seems to be doing just fine to place another one. If we ended making a false track or cause any other problems, this would be a problem for the patient. He has no symptoms of infection or other concerns he is just concerned that that catheter is not quite big enough and that sometimes he has drops of urine, rounded. We will flush the catheter and make sure the bleeding is adequately inflated and we'll have him follow closely with urology. He has no other complaints at this time and his appearance and exam are reassuring.  ----------------------------------------- 8:06 PM on 09/19/2015 -----------------------------------------  Catheter flushes easily, there is a little bit of drainage around it when he stands up, we offered to change the catheter but he refuses at this time. He would prefer to go home and follow-up with his urologist. We will therefore discharge him with return precautions. ____________________________________________   FINAL CLINICAL  IMPRESSION(S) / ED DIAGNOSES  Final diagnoses:  None      This chart was dictated using voice recognition software.  Despite best efforts to proofread,  errors can occur which can change meaning.     Schuyler Amor, MD 09/19/15 1913  Schuyler Amor, MD 09/19/15 2006

## 2015-09-19 NOTE — Discharge Instructions (Signed)
Return to the emergency room if you have any pain, change in the drainage from your Foley catheter, pain in your penis or Foley catheter, pain in your abdomen, vomiting, fever, or if you feel worse in any way.

## 2015-09-20 LAB — URINE CULTURE
CULTURE: NO GROWTH
SPECIAL REQUESTS: NORMAL

## 2015-10-01 ENCOUNTER — Encounter: Payer: Self-pay | Admitting: Urology

## 2015-10-23 ENCOUNTER — Other Ambulatory Visit: Payer: Self-pay | Admitting: Urology

## 2015-10-23 ENCOUNTER — Ambulatory Visit: Payer: Medicare Other | Admitting: Internal Medicine

## 2015-10-23 DIAGNOSIS — C61 Malignant neoplasm of prostate: Secondary | ICD-10-CM

## 2015-11-03 ENCOUNTER — Ambulatory Visit
Admission: RE | Admit: 2015-11-03 | Discharge: 2015-11-03 | Disposition: A | Discharge status: Home / Self Care | Payer: Medicare Other | Source: Ambulatory Visit | Attending: Urology | Admitting: Urology

## 2015-11-03 DIAGNOSIS — D181 Lymphangioma, any site: Secondary | ICD-10-CM | POA: Diagnosis not present

## 2015-11-03 DIAGNOSIS — C61 Malignant neoplasm of prostate: Secondary | ICD-10-CM | POA: Diagnosis not present

## 2015-11-25 NOTE — Pre-Procedure Instructions (Signed)
[place] Component Name Value Ref Range  Vent Rate (bpm) 61   QRS Interval (msec) 82   QT Interval (msec) 390   QTc (msec) 392   Result Narrative  Atrial fibrillation Abnormal ECG No previous ECGs available I reviewed and concur with this report. Electronically signed [id number], MD, Cristie Hem [id number]) on 09/08/2015 7:57:59 AM  Status Results Details    Initial consult on 09/04/2015 [place]")' href="[email or web address]/">Encounter Summary

## 2015-11-25 NOTE — Patient Instructions (Addendum)
  Your procedure is scheduled on: 12-02-15 (TUESDAY) Report to Same Day Surgery 2nd floor medical mall To find out your arrival time please call [phone] between 1PM - 3PM on  12-01-15 [facility])  Remember: Instructions that are not followed completely may result in serious medical risk, up to and including death, or upon the discretion of your surgeon and anesthesiologist your surgery may need to be rescheduled.    _x___ 1. Do not eat food or drink liquids after midnight. No gum chewing or hard candies.     __x__ 2. No Alcohol for 24 hours before or after surgery.   __x__3. No Smoking for 24 prior to surgery.   ____  4. Bring all medications with you on the day of surgery if instructed.    __x__ 5. Notify your doctor if there is any change in your medical condition     (cold, fever, infections).     Do not wear jewelry, make-up, hairpins, clips or nail polish.  Do not wear lotions, powders, or perfumes. You may wear deodorant.  Do not shave 48 hours prior to surgery. Men may shave face and neck.  Do not bring valuables to the hospital.    [facility] is not responsible for any belongings or valuables.               Contacts, dentures or bridgework may not be worn into surgery.  Leave your suitcase in the car. After surgery it may be brought to your room.  For patients admitted to the hospital, discharge time is determined by your treatment team.   Patients discharged the day of surgery will not be allowed to drive home.    Please read over the following fact sheets that you were given:   [facility] Preparing for Surgery and or MRSA Information   _x___ Take these medicines the morning of surgery with A SIP OF WATER:    1. ALTACE (RAMIPRIL)  2. ZOCOR (SIMVASTATIN)  3.  4.  5.  6.  ____ Fleet Enema (as directed)   ____ Use CHG Soap or sage wipes as directed on instruction sheet   ____ Use inhalers on the day of surgery and bring to hospital day of surgery  ____ Stop  metformin 2 days prior to surgery    ____ Take 1/2 of usual insulin dose the night before surgery and none on the morning of surgery.   _X___ Stop aspirin or coumadin, or plavix-STOP ELOQUIS 2 DAYS PRIOR TO SURGERY( PT STATES DR WOLFF TOLD HIM TO STOP 2 DAYS PRIOR)-LAST DOSE ON 11-29-15 (SATURDAY)  _x__ Stop Anti-inflammatories such as Advil, Aleve, Ibuprofen, Motrin, Naproxen,          Naprosyn, Goodies powders or aspirin products.STOP MELOXICAM NOW- Ok to take Tylenol.   ____ Stop supplements until after surgery.    ____ Bring C-Pap to the hospital.

## 2015-11-25 NOTE — Pre-Procedure Instructions (Signed)
Isaias Cowman, MD - 10/03/2015 9:30 AM EDT Formatting of this note may be different from the original. Established Patient Visit   Chief Complaint: Chief Complaint  Patient presents with  . Follow-up  stress echo/holter-been to ER for bladder problems  Date of Service: 10/03/2015 Date of Birth: July 31, 1940 PCP: Azzie Glatter, MD  History of Present Illness: Mr. Guetter is a 75 y.o.male patient who returns for  1. Chronic atrial fibrillation 2. Essential hypertension 3. Hyperlipidemia 4. RCA atherosclerosis observed on CT scan 5. Prostate cancer  Patient returns for follow-up. He denies chest pain shortness of breath. He is not expressing palpitations heart racing. He has mild intermittent peripheral edema. Recent CT scan prostate cancer revealed RCA atherosclerosis. ECG incidentally revealed atrial fibrillation with controlled ventricular rate of unknown duration. Holter monitor 09/04/15 revealed predominant atrial fibrillation with a mean heart rate of 89 bpm with frequent premature ventricular contractions. The patient underwent stress echocardiogram 09/29/2015, exercise 7 minutes and 46 seconds on a Bruce protocol, without chest pain or ECG changes. At baseline, 2D echocardiogram revealed normal left ventricular function with LVEF of 50%. At peak exercise there was no echocardiographic evidence for ischemia.  The patient has atrial fibrillation, chads vas score of 3, of unknown duration, appears asymptomatic, on Eliquis for stroke prevention. Patient denies any bleeding side effects.  The patient has essential hypertension, systolic blood pressure mildly elevated, currently on ramipril, which is well tolerated without apparent side effects. The patient follows a low-sodium, no added salt diet.  The patient has hyperlipidemia, currently on simvastatin, which is well tolerated without apparent side effects, followed by his primary care provider. The patient follows a  low-cholesterol, low-fat diet.  Past Medical and Surgical History  Past Medical History Past Medical History:  Diagnosis Date  . Bladder diverticulum  . History of kidney stones  . Hyperlipidemia, unspecified  . Hypertension  . Nephrolithiasis  . Prostate cancer (CMS-HCC)  . Thalassemia   Past Surgical History He has a past surgical history that includes ostate surgery (2001) and Hemorrhoid surgery.   Medications and Allergies  Current Medications  Current Outpatient Prescriptions  Medication Sig Dispense Refill  . apixaban (ELIQUIS) 5 mg tablet Take 1 tablet (5 mg total) by mouth 2 (two) times daily. 180 tablet 11  . aspirin 81 MG EC tablet Take 81 mg by mouth once daily.  . folic acid (FOLVITE) 1 MG tablet Take 1 mg by mouth once daily.  Marland Kitchen FOLIC ACID/MULTIVIT-MIN/LUTEIN (CENTRUM SILVER ORAL) Take by mouth.  . meloxicam (MOBIC) 15 MG tablet Take 1 tablet (15 mg total) by mouth once daily. 60 tablet 4  . PSYLLIUM SEED, WITH SUGAR, (METAMUCIL ORAL) Take by mouth once daily.   . ramipril (ALTACE) 10 MG capsule Take 2 capsules (20 mg total) by mouth once daily. 120 capsule 4  . simvastatin (ZOCOR) 40 MG tablet Take 1 tablet (40 mg total) by mouth once daily. 60 tablet 4   No current facility-administered medications for this visit.   Allergies: Review of patient's allergies indicates no known allergies.  Social and Family History  Social History reports that he quit smoking about 31 years ago. He has never used smokeless tobacco. He reports that he drinks alcohol. He reports that he does not use illicit drugs.  Family History Family History  Problem Relation Age of Onset  . Other Mother  Liver failure  . Stroke Father   Review of Systems   Review of Systems: The patient denies chest pain, shortness  of breath, orthopnea, paroxysmal nocturnal dyspnea, reports mild pedal edema, without palpitations, heart racing, presyncope, syncope. Review of 12 Systems is negative except  as described above.  Physical Examination   Vitals:BP 142/78  Pulse 76  Ht 170.2 cm (5\' 7" )  Wt 70.4 kg (155 lb 3.2 oz)  BMI 24.31 kg/m2 Ht:170.2 cm (5\' 7" ) Wt:70.4 kg (155 lb 3.2 oz) [id number] surface area is 1.82 meters squared. Body mass index is 24.31 kg/(m^2).  HEENT: Pupils equally reactive to light and accomodation  Neck: Supple without thyromegaly, carotid pulses 2+ Lungs: clear to auscultation bilaterally; no wheezes, rales, rhonchi Heart: Regular rate and rhythm. No gallops, murmurs or rub Abdomen: soft nontender, nondistended, with normal bowel sounds Extremities: no cyanosis, clubbing, or edema Peripheral Pulses: 2+ in all extremities, 2+ femoral pulses bilaterally  Assessment   75 y.o. male with  1. Atherosclerosis of native coronary artery of native heart without angina pectoris  2. Chronic atrial fibrillation (CMS-HCC)  3. Essential hypertension  4. Pure hypercholesterolemia   75 year old gentleman found to have RCA atherosclerosis on routine CT scan for prostate cancer, without chest pain, without evidence for ischemia on stress echocardiogram. Patient in chronic atrial fibrillation, of unknown duration, asymptomatic, on Eliquis for stroke prevention. The patient has essential hypertension, systolic blood pressure mildly elevated on current BP medications.  Plan   1. Continue current medications 2. Continue Eliquis for stroke prevention 3. Counseled patient about low-sodium diet 4. DASH diet printed instructions given to the patient 5. Counseled patient about low-cholesterol diet 6. Continue simvastatin for hyperlipidemia management  7. Low-fat and cholesterol diet printed instructions given to the patient 8. Defer cardiac catheterization 9. Return to clinic for follow-up in 4 months  No orders of the defined types were placed in this encounter.  Return in about 4 months (around 02/02/2016).

## 2015-11-25 NOTE — Pre-Procedure Instructions (Signed)
Echocardiogram stress test6/03/2016 [place] Component Name Value Ref Range  LV Ejection Fraction (%) 50   Aortic Valve Regurgitation Grade mild   Aortic Valve Stenosis Grade none   Mitral Valve Regurgitation Grade moderate   Mitral Valve Stenosis Grade none   Tricuspid Valve Regurgitation Grade moderate   Tricuspid Valve Regurgitation Max Velocity (m/s) 3.3 m/sec   Right Ventricle Systolic Pressure (mmHg) XX123456 mmHg   Result Narrative   CARDIOLOGY Ross Mccann, [facility] Mccann [facility] [id number]  [place] #: [id number]  [address] Ross Mccann, Ross Mccann Date: 09/29/2015 09:36 AM Adult Male Age: 75 yrs  ECHOCARDIOGRAM REPORTOutpatient KC::KCWC  STUDY:Stress Echo TAPE:MD1:Mccann, Ross ECHO:Yes DOPPLER:Yes FILE:0000-000-000BP: 139/99 mmHg  COLOR:YesCONTRAST:NoMACHINE:PhilipsHeight: 4 in  RV BIOPSY:No 3D:No SOUND QLTY:Moderate Weight: 154 lb MEDIUM:None BSA: 1.8 m2  ___________________________________________________________________________________________  HISTORY:Recent A.Fib, Coronary Artery Disease REASON:Assess, LV function Indication: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 ___________________________________________________________________________________________  STRESS ECHOCARDIOGRAPHY   Protocol:Treadmill ([place]) Drugs:None Target Heart Rate:122 bpmMaximum Predicted Heart Rate: 143  bpm  +-------------------+-------------------------+-------------------------+------------+  Stage  Duration (mm:ss) Heart Rate (bpm) BP  +-------------------+-------------------------+-------------------------+------------+ RESTING 81  139/99  +-------------------+-------------------------+-------------------------+------------+ EXERCISE  7:46 210  / +-------------------+-------------------------+-------------------------+------------+ RECOVERY  5:0680  165/92  +-------------------+-------------------------+-------------------------+------------+  Stress Duration:7:46 mm:ss Max Stress H.R.:210 bpmTarget Heart Rate Achieved: Yes  ___________________________________________________________________________________________  WALL SEGMENT CHANGES  Rest Stress Anterior Septum Basal:Normal Hyperkinetic [id number] Hyperkinetic  Apical:Normal Hyperkinetic  Anterior Wall Basal:Normal Hyperkinetic [id number] Hyperkinetic  Apical:Normal Hyperkinetic   Lateral Wall Basal:Normal Hyperkinetic [id number] Hyperkinetic  Apical:Normal Hyperkinetic   Posterior Wall Basal:Normal Hyperkinetic [id number] Hyperkinetic  Inferior Wall Basal:Normal Hyperkinetic [id number] Hyperkinetic  Apical:Normal Hyperkinetic  Inferior Septum Basal:Normal Hyperkinetic [id number] Hyperkinetic   Resting EF:50% (Est.) Stress EF: >55%  (Est.)  ___________________________________________________________________________________________  ADDITIONAL FINDINGS   ___________________________________________________________________________________________  STRESS ECG RESULTS   ECG Results:Normal  ___________________________________________________________________________________________ ECHOCARDIOGRAPHIC DESCRIPTIONS  LEFT VENTRICLE Size:Normal  Contraction:Normal  LV Masses:No Masses  [id number] Dias.FxClass:Normal  RIGHT VENTRICLE Size:Normal Free Wall:Normal  Contraction:Normal RV Masses:No mass  PERICARDIUM  Fluid:No effusion   _______________________________________________________________________________________ DOPPLER ECHO and OTHER SPECIAL PROCEDURES  Aortic:MILD ARNo AS   Mitral:MODERATE MRNo MS MV Inflow E Vel=nm*MV Annulus E'Vel=nm* E/E'Ratio=nm*  Tricuspid:MODERATE TRNo TS 331.4 cm/sec peak TR vel 54.1 mmHg peak RV pressure  Pulmonary:No PRNo PS    ___________________________________________________________________________________________ ECHOCARDIOGRAPHIC MEASUREMENTS 2D DIMENSIONS AORTA ValuesNormal RangeMAIN PAValuesNormal Range Annulus:nm* [2.3 - 2.9]PA Main:nm* [1.5 - 2.1] Aorta Sin:nm* [3.1 - 3.7] RIGHT VENTRICLE ST Junction:nm* [2.6 - 3.2]RV Base:nm* [ < 4.2] Asc.Aorta:nm* [2.6 - 3.4] RV Mid:nm* [ < 3.5]  LEFT VENTRICLERV Length:nm* [ < 8.6] LVIDd:nm* [4.2 - 5.9] INFERIOR VENA  CAVA LVIDs:nm* Max. IVC:nm* [ <= 2.1]  FS:nm* [> 25]Min. IVC:nm* SWT:nm* [0.6 - 1.0] ------------------ PWT:nm* [0.6 - 1.0] nm* - not measured  LEFT ATRIUM LA Diam:nm* [3.0 - 4.0] LA A4C Area:nm* [ < 20] LA Volume:nm* [18 - 58]   ___________________________________________________________________________________________ INTERPRETATION Normal Stress Echocardiogram NORMAL RIGHT VENTRICULAR SYSTOLIC FUNCTION MODERATE VALVULAR REGURGITATION (See above) NO VALVULAR STENOSIS NOTED   ___________________________________________________________________________________________ Electronically signed by: MD Ross Mccann on 09/30/2015 01:39 PM Performed By: Ross Mccann, RDCS, RVT Ordering Physician: Ross Mccann  ___________________________________________________________________________________________  Status Results Details    Appointment on 09/29/2015 [place]")' href="[email or web address]/">Encounter Summary  ECG stress test only6/03/2016 [place] ECG stress test only6/03/2016 [place] Component Name Value Ref Range       Status Results Details    Appointment on 09/29/2015 [place]")' href="[email or web address]/">Encounter Summary  [place]  ECG 12-lead5/18/2017 [place] ECG 12-lead5/18/2017 [place] Component Name Value Ref Range  Vent Rate (bpm) 61   QRS Interval (msec) 82   QT Interval (msec) 390   QTc (msec) 392   Result Narrative  Atrial fibrillation Abnormal ECG No previous ECGs available I reviewed and  concur with this report. Electronically signed [id number], MD, Ross Mccann [id number]) on 09/08/2015 7:57:59 AM  Status Results Details    Initial consult on 09/04/2015 [place]")' href="[email or web address]/">Encounter Summary  AMBULATORY ECG ANALYSIS5/18/2017 [place] AMBULATORY ECG ANALYSIS5/18/2017 [facility] Result Narrative  Ordered by an unspecified provider.  Status Results Details    Initial consult on 09/04/2015 [place]")' href="[email or web address]/">Encounter Summary  EXTERNAL RADIOLOGY RESULT - OTHER5/01/2016 [place] - OTHER5/01/2016 [place] Result Narrative  Ordered by an unspecified provider.  Status Results Details    [place] Orders Only on 08/27/2015 [place]")' href="[email or web address]/">Encounter Summary  RADIOLOGY EXTERNAL - BONE SCAN5/05/2015 [place] SCAN5/05/2015 [place] Result Narrative  Ordered by an unspecified provider.  Status Results Details    [place] Orders Only on 08/19/2015 [place]")' href="[email or web address]/">Encounter Summary  EXTERNAL RADIOLOGY RESULT -CT5/05/2015 [place] -CT5/05/2015 [place] Result Narrative  Ordered by an unspecified provider.  Status Results Details    [facility] Orders Only on 08/19/2015 [place]")' href="[email or web address]/">Encounter Summary  Date Index Date Index 2017 2017 [facility] Result Index Result Index AMBULATORY ECG ANALYSIS AMBULATORY ECG  ANALYSIS 09/04/2015 ECG 12-lead ECG 12-lead 09/04/2015 ECG stress test only ECG stress test only 09/29/2015 Echocardiogram stress test Echocardiogram stress test 09/29/2015 EXTERNAL RADIOLOGY RESULT - OTHER EXTERNAL RADIOLOGY RESULT - OTHER 08/27/2015 EXTERNAL RADIOLOGY RESULT -CT EXTERNAL RADIOLOGY RESULT -CT 08/19/2015 RADIOLOGY EXTERNAL - BONE SCAN RADIOLOGY EXTERNAL - BONE SCAN 08/19/2015

## 2015-11-26 ENCOUNTER — Encounter
Admission: RE | Admit: 2015-11-26 | Discharge: 2015-11-26 | Disposition: A | Discharge status: Home / Self Care | Payer: Medicare Other | Source: Ambulatory Visit | Attending: Urology | Admitting: Urology

## 2015-11-26 DIAGNOSIS — M109 Gout, unspecified: Secondary | ICD-10-CM | POA: Diagnosis not present

## 2015-11-26 DIAGNOSIS — Z01812 Encounter for preprocedural laboratory examination: Secondary | ICD-10-CM | POA: Insufficient documentation

## 2015-11-26 DIAGNOSIS — E785 Hyperlipidemia, unspecified: Secondary | ICD-10-CM | POA: Diagnosis not present

## 2015-11-26 DIAGNOSIS — I129 Hypertensive chronic kidney disease with stage 1 through stage 4 chronic kidney disease, or unspecified chronic kidney disease: Secondary | ICD-10-CM | POA: Diagnosis not present

## 2015-11-26 DIAGNOSIS — N189 Chronic kidney disease, unspecified: Secondary | ICD-10-CM | POA: Diagnosis not present

## 2015-11-26 DIAGNOSIS — Z8546 Personal history of malignant neoplasm of prostate: Secondary | ICD-10-CM | POA: Diagnosis not present

## 2015-11-26 DIAGNOSIS — I4891 Unspecified atrial fibrillation: Secondary | ICD-10-CM | POA: Diagnosis not present

## 2015-11-26 HISTORY — DX: Chronic kidney disease, unspecified: N18.9

## 2015-11-26 HISTORY — DX: Gout, unspecified: M10.9

## 2015-11-26 HISTORY — DX: Cardiac arrhythmia, unspecified: I49.9

## 2015-11-26 LAB — CBC
HEMATOCRIT: 40.6 % (ref 40.0–52.0)
Hemoglobin: 12.9 g/dL — ABNORMAL LOW (ref 13.0–18.0)
MCH: 21.1 pg — ABNORMAL LOW (ref 26.0–34.0)
MCHC: 31.8 g/dL — AB (ref 32.0–36.0)
MCV: 66.5 fL — ABNORMAL LOW (ref 80.0–100.0)
PLATELETS: 197 10*3/uL (ref 150–440)
RBC: 6.11 MIL/uL — ABNORMAL HIGH (ref 4.40–5.90)
RDW: 16.3 % — AB (ref 11.5–14.5)
WBC: 5.3 10*3/uL (ref 3.8–10.6)

## 2015-11-27 NOTE — H&P (Signed)
Ross Mccann, DEBAUN NO.:  [id number]  MEDICAL RECORD NO.:  [id number]  LOCATION:                                 FACILITY:  PHYSICIAN:  Maryan Puls          DATE OF BIRTH:  1940-08-24  DATE OF ADMISSION:  12/02/2015 DATE OF DISCHARGE:                            HISTORY AND PHYSICAL   Same-day surgery scheduled, August 15th.  CHIEF COMPLAINT:  Gross hematuria.  HISTORY OF PRESENT ILLNESS:  Mr. Mathiesen is a 75 year old, Asian male with gross hematuria, who was evaluated with CT scan and cystoscopy.  CT scan revealed calcification at the bladder base.  Cystoscopy confirmed that he had dysmorphic calcifications of the prostatic urethra at the 6 o'clock position.  The CT indicated normal kidneys without source of bleeding.  The bleeding source appeared to be the dysmorphic prostatic urethral calcifications.  The patient comes in now for transurethral removal and vaporization of the dysmorphic calcifications of the prostate.  More recently, the patient is also noted to have possible PSA recurrence of his prostate cancer.  He underwent ultrasound-guided needle biopsy of the prostate in April, which was benign.  Extensive evaluation including bone scan and pelvic CT did not reveal any metastatic disease.  The patient has a history of having brachytherapy with adjuvant hormone blockade back in 2002.  PAST MEDICAL HISTORY:  The patient had no drug allergies.  CURRENT MEDICATIONS:  Include; 1. Ramipril. 2. Simvastatin. 3. Aspirin. 4. Folic acid. 5. Meloxicam. 6. Centrum Silver.  SURGICAL HISTORY: 1. Remarkable for vasectomy in 1995. 2. I-125 brachytherapy in 2001. 3. Hemorrhoidectomy in 2001. 4. Transurethral resection of bladder neck contracture 2008. 5. Transurethral resection of bladder neck contracture and removal of     bladder stone 2010.  SOCIAL HISTORY:  The patient had tobacco use.  Consumes alcohol rarely.  FAMILY HISTORY:  Negative for  urological disease.  PAST AND CURRENT MEDICAL CONDITIONS: 1. Hypercholesterolemia. 2. Hypertension. 3. Thalassemia. 4. Gout.  REVIEW OF SYSTEMS:  The patient had chest pain, shortness of breath, diabetes, stroke, or heart disease.  PHYSICAL EXAM:  GENERAL:  Well-nourished Asian male, in no distress. HEENT:  Sclerae were clear.  Pupils are equally round, react to light and accommodation.  Extraocular movements are intact. NECK:  Supple.  No palpable cervical adenopathy.  No audible carotid bruits.  LUNGS:  Clear to auscultation.  CARDIOVASCULAR EXAM:  Regular rhythm rate without audible murmurs. ABDOMEN:  Soft, nontender abdomen. GENITOURINARY:  Uncircumcised testes, smooth nontender. RECTAL:  10 g, flat, smooth nontender prostate. NEUROMUSCULAR:  Alert and oriented x3.  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  PLAN:  Transurethral removal of prostatic calcifications with the holmium laser.          ______________________________ Maryan Puls     MW/MEDQ  D:  11/26/2015  T:  11/26/2015  Job:  [id number]

## 2015-12-02 ENCOUNTER — Encounter: Payer: Self-pay | Admitting: Anesthesiology

## 2015-12-02 ENCOUNTER — Ambulatory Visit
Admission: RE | Admit: 2015-12-02 | Discharge: 2015-12-02 | Disposition: A | Discharge status: Home / Self Care | Payer: Medicare Other | Source: Ambulatory Visit | Attending: Urology | Admitting: Urology

## 2015-12-02 ENCOUNTER — Encounter
Admission: RE | Disposition: A | Discharge status: Home / Self Care | Payer: Self-pay | Source: Ambulatory Visit | Attending: Urology

## 2015-12-02 ENCOUNTER — Ambulatory Visit: Payer: Medicare Other | Admitting: Anesthesiology

## 2015-12-02 DIAGNOSIS — M109 Gout, unspecified: Secondary | ICD-10-CM | POA: Insufficient documentation

## 2015-12-02 DIAGNOSIS — I4891 Unspecified atrial fibrillation: Secondary | ICD-10-CM | POA: Diagnosis not present

## 2015-12-02 DIAGNOSIS — I10 Essential (primary) hypertension: Secondary | ICD-10-CM | POA: Diagnosis not present

## 2015-12-02 DIAGNOSIS — D569 Thalassemia, unspecified: Secondary | ICD-10-CM | POA: Diagnosis not present

## 2015-12-02 DIAGNOSIS — Z8546 Personal history of malignant neoplasm of prostate: Secondary | ICD-10-CM | POA: Diagnosis not present

## 2015-12-02 DIAGNOSIS — E7800 Pure hypercholesterolemia, unspecified: Secondary | ICD-10-CM | POA: Insufficient documentation

## 2015-12-02 DIAGNOSIS — R310 Gross hematuria: Secondary | ICD-10-CM | POA: Insufficient documentation

## 2015-12-02 DIAGNOSIS — N420 Calculus of prostate: Secondary | ICD-10-CM | POA: Diagnosis not present

## 2015-12-02 DIAGNOSIS — Z87891 Personal history of nicotine dependence: Secondary | ICD-10-CM | POA: Diagnosis not present

## 2015-12-02 DIAGNOSIS — N3040 Irradiation cystitis without hematuria: Secondary | ICD-10-CM

## 2015-12-02 HISTORY — PX: TRANSURETHRAL RESECTION OF PROSTATE: SHX73

## 2015-12-02 HISTORY — PX: HOLMIUM LASER APPLICATION: SHX5852

## 2015-12-02 SURGERY — TURP (TRANSURETHRAL RESECTION OF PROSTATE)
Anesthesia: General

## 2015-12-02 MED ORDER — PROPOFOL 10 MG/ML IV BOLUS
INTRAVENOUS | Status: DC | PRN
  Administered 2015-12-02: 120 mg via INTRAVENOUS

## 2015-12-02 MED ORDER — LEVOFLOXACIN IN D5W 500 MG/100ML IV SOLN
INTRAVENOUS | Status: AC
  Administered 2015-12-02: 500 mg via INTRAVENOUS
  Filled 2015-12-02: qty 100

## 2015-12-02 MED ORDER — ONDANSETRON HCL 4 MG/2ML IJ SOLN: 4.0000 mg | Freq: Once | INTRAMUSCULAR | Status: DC | PRN

## 2015-12-02 MED ORDER — SUGAMMADEX SODIUM 200 MG/2ML IV SOLN
INTRAVENOUS | Status: DC | PRN
  Administered 2015-12-02: 200 mg via INTRAVENOUS

## 2015-12-02 MED ORDER — MIDAZOLAM HCL 2 MG/2ML IJ SOLN
INTRAMUSCULAR | Status: DC | PRN
  Administered 2015-12-02: 1 mg via INTRAVENOUS

## 2015-12-02 MED ORDER — LIDOCAINE HCL (CARDIAC) 20 MG/ML IV SOLN
INTRAVENOUS | Status: DC | PRN
  Administered 2015-12-02: 40 mg via INTRAVENOUS

## 2015-12-02 MED ORDER — FAMOTIDINE 20 MG PO TABS
ORAL_TABLET | ORAL | Status: AC
  Administered 2015-12-02: 20 mg via ORAL
  Filled 2015-12-02: qty 1

## 2015-12-02 MED ORDER — SUGAMMADEX SODIUM 200 MG/2ML IV SOLN: INTRAVENOUS | Status: DC | PRN

## 2015-12-02 MED ORDER — LEVOFLOXACIN IN D5W 500 MG/100ML IV SOLN
500.0000 mg | Freq: Once | INTRAVENOUS | Status: AC
  Administered 2015-12-02: 500 mg via INTRAVENOUS

## 2015-12-02 MED ORDER — ROCURONIUM BROMIDE 100 MG/10ML IV SOLN
INTRAVENOUS | Status: DC | PRN
  Administered 2015-12-02: 35 mg via INTRAVENOUS
  Administered 2015-12-02: 5 mg via INTRAVENOUS
  Administered 2015-12-02: 10 mg via INTRAVENOUS

## 2015-12-02 MED ORDER — HYOSCYAMINE SULFATE 0.125 MG SL SUBL: 0.1250 mg | SUBLINGUAL_TABLET | SUBLINGUAL | 3 refills | Status: DC | PRN

## 2015-12-02 MED ORDER — LIDOCAINE HCL 2 % EX GEL
CUTANEOUS | Status: AC
  Filled 2015-12-02: qty 10

## 2015-12-02 MED ORDER — LACTATED RINGERS IV SOLN: INTRAVENOUS | Status: DC | PRN

## 2015-12-02 MED ORDER — BELLADONNA ALKALOIDS-OPIUM 16.2-60 MG RE SUPP
RECTAL | Status: AC
  Filled 2015-12-02: qty 1

## 2015-12-02 MED ORDER — BELLADONNA ALKALOIDS-OPIUM 16.2-60 MG RE SUPP
RECTAL | Status: DC | PRN
  Administered 2015-12-02: 1 via RECTAL

## 2015-12-02 MED ORDER — LIDOCAINE HCL 2 % EX GEL
CUTANEOUS | Status: DC | PRN
  Administered 2015-12-02: 1 via URETHRAL

## 2015-12-02 MED ORDER — FENTANYL CITRATE (PF) 100 MCG/2ML IJ SOLN: 25.0000 ug | INTRAMUSCULAR | Status: DC | PRN

## 2015-12-02 MED ORDER — PHENYLEPHRINE HCL 10 MG/ML IJ SOLN
INTRAMUSCULAR | Status: DC | PRN
  Administered 2015-12-02 (×2): 100 ug via INTRAVENOUS

## 2015-12-02 MED ORDER — FAMOTIDINE 20 MG PO TABS
20.0000 mg | ORAL_TABLET | Freq: Once | ORAL | Status: AC
  Administered 2015-12-02: 20 mg via ORAL

## 2015-12-02 MED ORDER — SODIUM CHLORIDE 0.9 % IV SOLN
INTRAVENOUS | Status: DC
  Administered 2015-12-02 (×2): via INTRAVENOUS

## 2015-12-02 MED ORDER — FENTANYL CITRATE (PF) 100 MCG/2ML IJ SOLN
INTRAMUSCULAR | Status: DC | PRN
  Administered 2015-12-02: 50 ug via INTRAVENOUS

## 2015-12-02 MED ORDER — KETOROLAC TROMETHAMINE 30 MG/ML IJ SOLN
INTRAMUSCULAR | Status: DC | PRN
  Administered 2015-12-02: 30 mg via INTRAVENOUS

## 2015-12-02 SURGICAL SUPPLY — 24 items
ADAPTER IRRIG TUBE 2 SPIKE SOL (ADAPTER) ×3 IMPLANT
ADPR TBG 2 SPK PMP STRL ASCP (ADAPTER) ×1
BAG DRAIN CYSTO-URO LG1000N (MISCELLANEOUS) ×3 IMPLANT
BAG DRN LRG CPC RND TRDRP CNTR (MISCELLANEOUS) ×1
BAG URO DRAIN 2000ML W/SPOUT (MISCELLANEOUS) ×3 IMPLANT
BAG URO DRAIN 4000ML (MISCELLANEOUS) ×3 IMPLANT
CATH FOLEY 2WAY SIL 20X30 (CATHETERS) ×2 IMPLANT
CORD URO TURP 10FT (MISCELLANEOUS) ×3 IMPLANT
ELECT REM PT RETURN 9FT ADLT (ELECTROSURGICAL) ×3
ELECTRODE REM PT RTRN 9FT ADLT (ELECTROSURGICAL) ×1 IMPLANT
FEE TECHNICIAN ONLY PER HOUR (MISCELLANEOUS) ×2 IMPLANT
FIBER LASER 1000 (Laser) ×2 IMPLANT
GLOVE BIO SURGEON STRL SZ7 (GLOVE) ×6 IMPLANT
GLOVE BIO SURGEON STRL SZ7.5 (GLOVE) ×3 IMPLANT
GOWN STRL REUS W/ TWL LRG LVL3 (GOWN DISPOSABLE) ×1 IMPLANT
GOWN STRL REUS W/TWL LRG LVL3 (GOWN DISPOSABLE) ×3
IV SET PRIMARY 15D 139IN B9900 (IV SETS) ×3 IMPLANT
KIT RM TURNOVER CYSTO AR (KITS) ×3 IMPLANT
PACK CYSTO AR (MISCELLANEOUS) ×3 IMPLANT
SET IRRIGATING DISP (SET/KITS/TRAYS/PACK) ×3 IMPLANT
SOL .9 NS 3000ML IRR  AL (IV SOLUTION) ×2
SOL .9 NS 3000ML IRR AL (IV SOLUTION) ×1
SOL .9 NS 3000ML IRR UROMATIC (IV SOLUTION) ×1 IMPLANT
WATER STERILE IRR 1000ML POUR (IV SOLUTION) ×3 IMPLANT

## 2015-12-02 NOTE — Anesthesia Postprocedure Evaluation (Signed)
Anesthesia Post Note  Patient: Ross Mccann  Procedure(s) Performed: Procedure(s) (LRB): TRANSURETHRAL RESECTION OF THE PROSTATE (TURP) (N/A) HOLMIUM LASER APPLICATION (N/A)  Patient location during evaluation: PACU Anesthesia Type: General Level of consciousness: awake and alert and oriented Pain management: pain level controlled Vital Signs Assessment: post-procedure vital signs reviewed and stable Respiratory status: spontaneous breathing Cardiovascular status: blood pressure returned to baseline Anesthetic complications: no    Last Vitals:  Vitals:   12/02/15 1440 12/02/15 1454  BP: 118/85 (!) 147/86  Pulse: 69 89  Resp: 15 14  Temp: 36.3 C     Last Pain:  Vitals:   12/02/15 1440  TempSrc:   PainSc: 0-No pain                 Valissa Lyvers

## 2015-12-02 NOTE — Anesthesia Preprocedure Evaluation (Signed)
Anesthesia Evaluation  Patient identified by MRN, date of birth, ID band Patient awake    Reviewed: Allergy & Precautions, NPO status , Patient's Chart, lab work & pertinent test results  Airway Mallampati: II  TM Distance: >3 FB     Dental  (+) Missing, Chipped, Caps, Implants   Pulmonary neg pulmonary ROS, former smoker,    Pulmonary exam normal        Cardiovascular hypertension, Pt. on medications + dysrhythmias Atrial Fibrillation  Rhythm:Irregular     Neuro/Psych negative neurological ROS  negative psych ROS   GI/Hepatic negative GI ROS, Neg liver ROS,   Endo/Other  negative endocrine ROS  Renal/GU Renal InsufficiencyRenal disease     Musculoskeletal negative musculoskeletal ROS (+)   Abdominal Normal abdominal exam  (+)   Peds negative pediatric ROS (+)  Hematology negative hematology ROS (+)   Anesthesia Other Findings   Reproductive/Obstetrics                             Anesthesia Physical Anesthesia Plan  ASA: III  Anesthesia Plan: General   Post-op Pain Management:    Induction: Intravenous  Airway Management Planned: Oral ETT  Additional Equipment:   Intra-op Plan:   Post-operative Plan: Extubation in OR  Informed Consent: I have reviewed the patients History and Physical, chart, labs and discussed the procedure including the risks, benefits and alternatives for the proposed anesthesia with the patient or authorized representative who has indicated his/her understanding and acceptance.   Dental advisory given  Plan Discussed with: CRNA and Surgeon  Anesthesia Plan Comments:         Anesthesia Quick Evaluation

## 2015-12-02 NOTE — Transfer of Care (Signed)
Immediate Anesthesia Transfer of Care Note  Patient: Haydan B Bambach  Procedure(s) Performed: Procedure(s): TRANSURETHRAL RESECTION OF THE PROSTATE (TURP) (N/A) HOLMIUM LASER APPLICATION (N/A)  Patient Location: PACU  Anesthesia Type:General  Level of Consciousness: sedated  Airway & Oxygen Therapy: Patient Spontanous Breathing and Patient connected to face mask oxygen  Post-op Assessment: Report given to RN and Post -op Vital signs reviewed and stable  Post vital signs: Reviewed and stable  Last Vitals:  Vitals:   12/02/15 1206 12/02/15 1400  BP: 140/90 (P) 107/69  Pulse: (!) 46   Resp: 16   Temp: 37.1 C (P) 36.2 C    Last Pain:  Vitals:   12/02/15 1400  TempSrc:   PainSc: (P) Asleep         Complications: No apparent anesthesia complications

## 2015-12-02 NOTE — H&P (Signed)
Date of Initial H&P: 11/26/15  History reviewed, patient examined, no change in status, stable for surgery.

## 2015-12-02 NOTE — Op Note (Signed)
Preoperative diagnosis: 1. Gross hematuria                                             2. Dysmorphic prostatic calcifications  Postoperative diagnosis: Same    Procedure: Litholapaxy of prostatic calcifications with holmium laser  Surgeon: Otelia Limes. Yves Dill MD  Anesthesia: General  Indications:See the history and physical. After informed consent the above procedure(s) were requested     Technique and findings: After adequate general anesthesia obtained patient was placed into dorsolithotomy position and the perineum was prepped and draped in the usual fashion. The 7 French scope was coupled camera and visually advanced into the bladder. The bladder was thoroughly inspected and no mucosal lesions were identified. Both ureteral orifices were identified and had clear eflux. A 1 cm calcification was noted at the 12:00 position at the bladder neck anteriorly. 2 small calcifications measuring 12 mm were noted posteriorly at the 7:00 position. The 1000  holmium laser fiber was introduced through the scope and set at power of 10 W and frequency of 10 Hz. The calcifications were fully disintegrated. No bleeders were identified. The scope was then removed and 10 cc of viscous Xylocaine instilled within the urethra and the bladder. A 20 French silicone catheter was placed and had clear drainage. A B&O suppository was placed. The procedure was then terminated and patient transferred to the recovery room in stable condition.

## 2015-12-02 NOTE — Anesthesia Procedure Notes (Signed)
Procedure Name: Intubation Date/Time: 12/02/2015 1:35 PM Performed by: Allean Found Pre-anesthesia Checklist: Patient identified, Emergency Drugs available, Suction available, Patient being monitored and Timeout performed Patient Re-evaluated:Patient Re-evaluated prior to inductionOxygen Delivery Method: Circle system utilized Preoxygenation: Pre-oxygenation with 100% oxygen Intubation Type: IV induction Ventilation: Mask ventilation without difficulty Laryngoscope Size: Mac and 3 Grade View: Grade I Tube type: Oral Tube size: 7.0 mm Number of attempts: 1 Placement Confirmation: ETT inserted through vocal cords under direct vision Secured at: 22 cm Tube secured with: Tape Dental Injury: Teeth and Oropharynx as per pre-operative assessment

## 2015-12-02 NOTE — Discharge Instructions (Addendum)
AMBULATORY SURGERY  DISCHARGE INSTRUCTIONS   1) The drugs that you were given will stay in your system until tomorrow so for the next 24 hours you should not:  A) Drive an automobile B) Make any legal decisions C) Drink any alcoholic beverage   2) You may resume regular meals tomorrow.  Today it is better to start with liquids and gradually work up to solid foods.  You may eat anything you prefer, but it is better to start with liquids, then soup and crackers, and gradually work up to solid foods.   3) Please notify your doctor immediately if you have any unusual bleeding, trouble breathing, redness and pain at the surgery site, drainage, fever, or pain not relieved by medication.    4) Additional Instructions:        Please contact your physician with any problems or Same Day Surgery at [phone], Monday through Friday 6 am to 4 pm, or [place] [facility] number at [phone].       Hematuria, Adult   Hematuria is blood in your urine. It can be caused by a bladder infection, kidney infection, prostate infection, kidney stone, or cancer of your urinary tract. Infections can usually be treated with medicine, and a kidney stone usually will pass through your urine. If neither of these is the cause of your hematuria, further workup to find out the reason may be needed. It is very important that you tell your health care provider about any blood you see in your urine, even if the blood stops without treatment or happens without causing pain. Blood in your urine that happens and then stops and then happens again can be a symptom of a very serious condition. Also, pain is not a symptom in the initial stages of many urinary cancers. HOME CARE INSTRUCTIONS   Drink lots of fluid, 3-4 quarts a day. If you have been diagnosed with an infection, cranberry juice is especially recommended, in addition to large amounts of water.  Avoid caffeine, tea, and carbonated  beverages because they tend to irritate the bladder.  Avoid alcohol because it may irritate the prostate.  Take all medicines as directed by your health care provider.  If you were prescribed an antibiotic medicine, finish it all even if you start to feel better.  If you have been diagnosed with a kidney stone, follow your health care provider's instructions regarding straining your urine to catch the stone.  Empty your bladder often. Avoid holding urine for long periods of time.  After a bowel movement, women should cleanse front to back. Use each tissue only once.  Empty your bladder before and after sexual intercourse if you are a male. SEEK MEDICAL CARE IF:  You develop back pain.  You have a fever.  You have a feeling of sickness in your stomach (nausea) or vomiting.  Your symptoms are not better in 3 days. Return sooner if you are getting worse. SEEK IMMEDIATE MEDICAL CARE IF:   You develop severe vomiting and are unable to keep the medicine down.  You develop severe back or abdominal pain despite taking your medicines.  You begin passing a large amount of blood or clots in your urine.  You feel extremely weak or faint, or you pass out. MAKE SURE YOU:   Understand these instructions.  Will watch your condition.  Will get help right away if you are not doing well or get worse.   This information is not intended to replace advice  given to you by your health care provider. Make sure you discuss any questions you have with your health care provider.   Document Released: 04/05/2005 Document Revised: 04/26/2014 Document Reviewed: 12/04/2012 Elsevier Interactive Patient Education 2016 [employer]. Prostate Cancer  Prostate cancer is the abnormal growth of cells in your prostate gland. Your prostate gland is involved in the production of semen. It is located below your bladder and in front of your rectum. A normal prostate gland is the size of a walnut and surrounds  the tube that carries urine from the bladder (urethra).  RISK FACTORS  Age older than 29 years.  African American race.  Obesity.  Family history of prostate cancer.  Family history of breast cancer. SIGNS AND SYMPTOMS   Frequent urination.  Weak or interrupted flow of urine.  Difficulty starting or stopping urination.  Inability to urinate.  Painful or burning urination.  Painful ejaculation.  Blood in urine or semen.  Persistent pain or discomfort in the lower back, lower abdomen, hips, or upper thighs.  Difficulty getting an erection.  Difficulty emptying your bladder completely. DIAGNOSIS  Prostate cancer can be diagnosed by a digital rectal exam, prostate-specific antigen (PSA) blood test, transrectal ultrasonography, and then a biopsy to test a tissue sample. Usually 8-12 samples are taken. The tissue is sent to a specialist who looks at tissues and cells (pathologist). If cancer is diagnosed, the next step is to stage the cancer. This means it is put in a category based on how far the cancer has spread. This is important for helping your health care providers plan appropriate treatment. The different stages of prostate cancer are as follows:  Stage I. Cancer is found in the prostate only. It cannot be felt during a digital rectal exam and is not visible by imaging. It is usually found accidentally, such as during surgery for other prostate problems.  Stage II. Cancer is more advanced than in stage I but has not spread outside the prostate.  Stage III. Cancer has spread beyond the outer layer of the prostate to nearby tissues. Cancer may be found in the seminal vesicles.  Stage IV. Cancer has spread to lymph nodes or to other parts of the body. The cancer may have spread to the bladder, rectum, bones, liver, or lungs. Prostate cancer often spreads to the bones. Imaging scans, such as a bone scan, CT, PET, or MRI, are done to help in the staging process. TREATMENT    Treatments such as surgery, medicines, and radiation may be recommended based on the stage of the cancer and other factors. Once cancer of the prostate has been diagnosed, your health care provider will discuss your treatment with you. Your health care provider will help you decide on the best course of treatment. Treatment often depends on your age, health, and other risk factors. The more common methods of treatment are:  Observation for early stage prostate cancer.  Open surgery. This involves a surgery to remove the prostate.  Laparoscopic prostatectomy to remove the prostate and lymph nodes.  Robotic prostatectomy to remove the prostate and lymph nodes.  External beam radiation, which aims beams of radiation from outside the body at the prostate.  Internal radiation (brachytherapy), which uses radioactive needles, 40-100 pellets (seeds), wires, or catheters implanted directly into the prostate gland.  High-intensity, focused ultrasonography to destroy cancer cells.  Cryosurgery to freeze and destroy prostate cancer cells.  Chemotherapy medicines to stop the growth of cancer cells either by killing them or  by stopping them from multiplying.  Hormone treatment. Medicines that stop your body from producing testosterone, or medicines that block testosterone from reaching cancer cells.  Orchiectomy. This is surgery to remove your testicles. HOME CARE INSTRUCTIONS  Take medicines only as directed by your health care provider.  Maintain a healthy diet.  Get plenty of sleep.  Consider joining a support group. This may help you learn to cope with the stress of having prostate cancer.  Seek advice to help you manage treatment side effects.  Keep all follow-up visits as directed by your health care provider.  Inform your cancer specialist if you are admitted to the hospital.  Continue sexual expression. Consider touching, holding, hugging, and caressing as ways to continue sharing  sexuality with your partner. SEEK MEDICAL CARE IF:  You have trouble urinating.  You have blood in your urine.  You have trouble getting an erection.  You have pain in your hips, back, or chest. SEEK IMMEDIATE MEDICAL CARE IF:  You have weakness or numbness in your legs.  You have involuntary loss of urine or stool (incontinence).   This information is not intended to replace advice given to you by your health care provider. Make sure you discuss any questions you have with your health care provider.   Document Released: 04/05/2005 Document Revised: 12/25/2014 Document Reviewed: 09/22/2012 Elsevier Interactive Patient Education 2016 [facility] for Prostate Cancer Brachytherapy for prostate cancer is radiation treatment given from the inside of the prostate instead of from the outside by an external beam. There are two types of brachytherapy:  Low-dose-rate therapy. This involves seed or pellet implantation.  High-dose-rate therapy. This is given by thin tubes that contain radioactive material. When the seed method (also called implant therapy) is used, 80 to 120 little pellets are placed into the prostate and left in place. Because the radiation does not travel far from the prostate, healthy, noncancerous tissues around the prostate receive only a small dose of radiation, which helps protect them from injury. The radiation fades away over the next few months. If the high-dose therapy is used, the thin tubes of radiation are removed after the treatment and there is no radiation left in the prostate when you leave the hospital. This type of treatment is followed by a course of radiation by an external beam method on an outpatient basis. LET [facility] CARE PROVIDER KNOW ABOUT:   Any allergies you have.  All medicines you are taking, including blood thinners, vitamins, herbs, eye drops, creams, and over-the-counter medicines.  Previous problems you or members of  your family have had with the use of anesthetics.  Any blood disorders you have had.  Previous surgeries you have had.  Medical conditions you have. RISKS AND COMPLICATIONS  Generally, brachytherapy for prostate cancer is a safe procedure. However, problems can occur.  Possible short-term problems include:  Difficulty passing urine. You may need a catheter for a few days to a month.  Blood in the urine or semen.  A feeling of constipation because of prostate swelling.  Frequent feeling of an urgent need to urinate. Possible long-term problems include:  Inflammation of the rectum. This happens in about 2% of people who have the procedure.  Erection problems. These vary with age and occur in about 15-40% of men.  Difficulty urinating. This is caused by scarring in the urethra.  Diarrhea. BEFORE THE PROCEDURE  You will be scheduled for some tests, which may include an ultrasound, CT scan, or MRI.  These tests do not hurt. A specialist in radiation treatment will meet with you and will decide which type of brachytherapy to use based on these imaging tests. PROCEDURE   You will be given a medicine that makes you fall asleep (general anesthetic).  A small probe will be placed in the rectum.  Ultrasound waves will be used to guide the insertion of small tubes into the prostate in preplanned locations.  If the seed method (low-dose) is used:  Small pellets the size of a rice grain will be placed into the tube.  The tube will then be removed, leaving the seeds in the prostate.  If the high-dose method is used:  Radioactive wires will be inserted and left in until a specific dose of radiation has been given.  The wires will then be removed.  In both methods, a catheter is usually placed into the bladder. AFTER THE PROCEDURE  Follow-up depends on the type of treatment given. With the high-dose treatment, there is no radiation risk after the treatment. Usually additional  treatment with external beam radiation will be provided. This will require a series of visits back to the clinic as an outpatient.  With the seed implant, some physicians recommend that close contact with children and pregnant women be limited because of the low dose of radiation still in the prostate. That radiation will be almost gone by 2 months, and by 4-6 months the levels will be almost undetectable.   This information is not intended to replace advice given to you by your health care provider. Make sure you discuss any questions you have with your health care provider.   Document Released: 09/13/2005 Document Revised: 04/26/2014 Document Reviewed: 09/26/2012 Elsevier Interactive Patient Education [employer].

## 2015-12-02 NOTE — OR Nursing (Signed)
Pt insisted on switching to leg bag before discharge. INSTRUCTED PT TO CHANGE BACK TO LARGE BAG BEFORE BEDTIME for better gravity drainage. New packaged large  bag given to pt.

## 2015-12-05 ENCOUNTER — Encounter: Payer: Self-pay | Admitting: Urology

## 2015-12-08 ENCOUNTER — Encounter: Payer: Self-pay | Admitting: Urology

## 2016-01-05 ENCOUNTER — Emergency Department
Admission: EM | Admit: 2016-01-05 | Discharge: 2016-01-06 | Disposition: A | Discharge status: Home / Self Care | Payer: Medicare Other | Attending: Student in an Organized Health Care Education/Training Program | Admitting: Student in an Organized Health Care Education/Training Program

## 2016-01-05 DIAGNOSIS — Z87891 Personal history of nicotine dependence: Secondary | ICD-10-CM | POA: Diagnosis not present

## 2016-01-05 DIAGNOSIS — Z791 Long term (current) use of non-steroidal anti-inflammatories (NSAID): Secondary | ICD-10-CM | POA: Insufficient documentation

## 2016-01-05 DIAGNOSIS — Z79899 Other long term (current) drug therapy: Secondary | ICD-10-CM | POA: Diagnosis not present

## 2016-01-05 DIAGNOSIS — R110 Nausea: Secondary | ICD-10-CM | POA: Diagnosis present

## 2016-01-05 DIAGNOSIS — I951 Orthostatic hypotension: Secondary | ICD-10-CM | POA: Insufficient documentation

## 2016-01-05 DIAGNOSIS — Z8546 Personal history of malignant neoplasm of prostate: Secondary | ICD-10-CM | POA: Insufficient documentation

## 2016-01-05 DIAGNOSIS — I129 Hypertensive chronic kidney disease with stage 1 through stage 4 chronic kidney disease, or unspecified chronic kidney disease: Secondary | ICD-10-CM | POA: Insufficient documentation

## 2016-01-05 DIAGNOSIS — N189 Chronic kidney disease, unspecified: Secondary | ICD-10-CM | POA: Insufficient documentation

## 2016-01-05 LAB — COMPREHENSIVE METABOLIC PANEL
ALT: 21 U/L (ref 17–63)
AST: 32 U/L (ref 15–41)
Albumin: 4.2 g/dL (ref 3.5–5.0)
Alkaline Phosphatase: 70 U/L (ref 38–126)
Anion gap: 8 (ref 5–15)
BUN: 23 mg/dL — AB (ref 6–20)
CHLORIDE: 98 mmol/L — AB (ref 101–111)
CO2: 23 mmol/L (ref 22–32)
CREATININE: 1.55 mg/dL — AB (ref 0.61–1.24)
Calcium: 8.8 mg/dL — ABNORMAL LOW (ref 8.9–10.3)
GFR calc Af Amer: 48 mL/min — ABNORMAL LOW (ref >60)
GFR, EST NON AFRICAN AMERICAN: 41 mL/min — AB (ref >60)
Glucose, Bld: 130 mg/dL — ABNORMAL HIGH (ref 65–99)
Potassium: 4.5 mmol/L (ref 3.5–5.1)
SODIUM: 129 mmol/L — AB (ref 135–145)
Total Bilirubin: 0.5 mg/dL (ref 0.3–1.2)
Total Protein: 7.6 g/dL (ref 6.5–8.1)

## 2016-01-05 LAB — CBC
HCT: 33.8 % — ABNORMAL LOW (ref 40.0–52.0)
Hemoglobin: 11 g/dL — ABNORMAL LOW (ref 13.0–18.0)
MCH: 21.2 pg — AB (ref 26.0–34.0)
MCHC: 32.6 g/dL (ref 32.0–36.0)
MCV: 65.1 fL — AB (ref 80.0–100.0)
PLATELETS: 203 10*3/uL (ref 150–440)
RBC: 5.19 MIL/uL (ref 4.40–5.90)
RDW: 16.9 % — AB (ref 11.5–14.5)
WBC: 5.1 10*3/uL (ref 3.8–10.6)

## 2016-01-05 LAB — URINALYSIS COMPLETE WITH MICROSCOPIC (ARMC ONLY)
BILIRUBIN URINE: NEGATIVE
Bacteria, UA: NONE SEEN
GLUCOSE, UA: NEGATIVE mg/dL
Hgb urine dipstick: NEGATIVE
Leukocytes, UA: NEGATIVE
Nitrite: NEGATIVE
Protein, ur: 30 mg/dL — AB
SQUAMOUS EPITHELIAL / LPF: NONE SEEN
Specific Gravity, Urine: 1.013 (ref 1.005–1.030)
pH: 5 (ref 5.0–8.0)

## 2016-01-05 LAB — LACTIC ACID, PLASMA: Lactic Acid, Venous: 1.9 mmol/L (ref 0.5–1.9)

## 2016-01-05 MED ORDER — ONDANSETRON 4 MG PO TBDP
ORAL_TABLET | ORAL | Status: AC
  Filled 2016-01-05: qty 1

## 2016-01-05 MED ORDER — ONDANSETRON 4 MG PO TBDP
4.0000 mg | ORAL_TABLET | Freq: Once | ORAL | Status: AC | PRN
  Administered 2016-01-05: 4 mg via ORAL

## 2016-01-05 MED ORDER — SODIUM CHLORIDE 0.9 % IV SOLN
Freq: Once | INTRAVENOUS | Status: AC
  Administered 2016-01-05: 20:00:00 via INTRAVENOUS

## 2016-01-05 MED ORDER — ONDANSETRON HCL 4 MG/2ML IJ SOLN
4.0000 mg | Freq: Once | INTRAMUSCULAR | Status: AC
  Administered 2016-01-05: 4 mg via INTRAVENOUS
  Filled 2016-01-05: qty 2

## 2016-01-05 NOTE — ED Provider Notes (Signed)
[facility] Emergency Department Provider Note    First MD Initiated Contact with Patient 01/05/16 2004     (approximate)  I have reviewed the triage vital signs and the nursing notes.   HISTORY  Chief Complaint Emesis and Fever    HPI Ross Mccann is a 75 y.o. male pleasant gentleman with a history of A. fib as well as prostate cancer on Eliquis presenting with 2 days of worsening lightheadedness upon standing. Denies any focal numbness or tingling. Denies any trauma. States that today after standing he did become nauseated. He did not have any vomiting episodes. He denies any chest pain or shortness of breath. Patient is also status post prostate procedure on oral Cipro. Had several episodes of hematuria last week but none today. He denies any dysuria at this time. No flank pain. With regards to the lightheadedness and dizziness the patient is very specific in that the symptoms only occur when the patient is rising from a seated position.  He is able to avoid the symptoms if he still is very slowly and allows himself to equilibrate. It is not reproduced with shaking his head. It is not a violent vertigo. He denies any previous history of vertigo. Denies any history of stroke.   Past Medical History:  Diagnosis Date  . Atrial fibrillation ([place])   . Cancer [facility])    prostate  . Chronic kidney disease    H/O KIDNEY STONES  . Dysrhythmia   . Gout   . Hyperlipidemia   . Hypertension   . Prostate cancer ([place])     There are no active problems to display for this patient.   Past Surgical History:  Procedure Laterality Date  . COLONOSCOPY    . HEMORROIDECTOMY    . HOLMIUM LASER APPLICATION N/A 0000000   Procedure: HOLMIUM LASER APPLICATION;  Surgeon: Royston Cowper, MD;  Location: [facility] ORS;  Service: Urology;  Laterality: N/A;  . INSERTION PROSTATE RADIATION SEED    . LITHOTRIPSY    . TRANSURETHRAL RESECTION OF PROSTATE N/A 12/02/2015   Procedure: TRANSURETHRAL RESECTION OF THE PROSTATE (TURP);  Surgeon: Royston Cowper, MD;  Location: [facility] ORS;  Service: Urology;  Laterality: N/A;  . VASECTOMY      Prior to Admission medications   Medication Sig Start Date End Date Taking? Authorizing Provider  acetaminophen (TYLENOL) 325 MG tablet Take 650 mg by mouth every 6 (six) hours as needed.    Historical Provider, MD  apixaban (ELIQUIS) 5 MG TABS tablet Take 5 mg by mouth 2 (two) times daily.    Historical Provider, MD  folic acid (FOLVITE) Q000111Q MCG tablet Take 800 mcg by mouth daily.    Historical Provider, MD  hyoscyamine (LEVSIN/SL) 0.125 MG SL tablet Place 1 tablet (0.125 mg total) under the tongue every 4 (four) hours as needed. 1-2 TABS 12/02/15   Royston Cowper, MD  meloxicam (MOBIC) 15 MG tablet Take 15 mg by mouth daily as needed for pain.     Historical Provider, MD  Multiple Vitamin (MULTIVITAMIN WITH MINERALS) TABS tablet Take 1 tablet by mouth 3 (three) times a week.    Historical Provider, MD  ramipril (ALTACE) 10 MG capsule Take 10 mg by mouth 2 (two) times daily.    Historical Provider, MD  simvastatin (ZOCOR) 40 MG tablet Take 40 mg by mouth every morning.     Historical Provider, MD    Allergies Review of patient's allergies indicates no known allergies.  No  family history on file.  Social History Social History  Substance Use Topics  . Smoking status: Former Smoker    Packs/day: 1.00    Years: 20.00    Types: Cigarettes    Quit date: 09/18/1978  . Smokeless tobacco: Never Used  . Alcohol use 4.2 oz/week    7 Cans of beer per week     Comment: 1 BEER DAY    Review of Systems Patient denies headaches, rhinorrhea, blurry vision, numbness, shortness of breath, chest pain, edema, cough, abdominal pain, nausea, vomiting, diarrhea, dysuria, fevers, rashes or hallucinations unless otherwise stated above in HPI. ____________________________________________   PHYSICAL EXAM:  VITAL SIGNS: Vitals:   01/05/16  1632  BP: (!) 158/64  Pulse: (!) 123  Resp: (!) 22  Temp: 99.1 F (37.3 C)   Constitutional: Alert and oriented. Well appearing and in no acute distress. Eyes: Conjunctivae are normal. PERRL. EOMI. Head: Atraumatic. Nose: No congestion/rhinnorhea. Mouth/Throat: Mucous membranes are moist.  Oropharynx non-erythematous. Neck: No stridor. Painless ROM. No cervical spine tenderness to palpation Hematological/Lymphatic/Immunilogical: No cervical lymphadenopathy. Cardiovascular: Normal rate, regular rhythm. Grossly normal heart sounds.  Good peripheral circulation. Respiratory: Normal respiratory effort.  No retractions. Lungs CTAB. Gastrointestinal: Soft and nontender. No distention. No abdominal bruits. No CVA tenderness. Genitourinary:  Musculoskeletal: No lower extremity tenderness nor edema.  No joint effusions. Neurologic:  CN- intact.  No facial droop, Normal FNF.  Normal heel to shin.  Sensation intact bilaterally. Normal speech and language. No gross focal neurologic deficits are appreciated. No gait instability. Skin:  Skin is warm, dry and intact. No rash noted. Psychiatric: Mood and affect are normal. Speech and behavior are normal. ____________________________________________   LABS (all labs ordered are listed, but only abnormal results are displayed)  Results for orders placed or performed during the hospital encounter of 01/05/16 (from the past 24 hour(s))  Comprehensive metabolic panel     Status: Abnormal   Collection Time: 01/05/16  4:35 PM  Result Value Ref Range   Sodium 129 (L) 135 - 145 mmol/L   Potassium 4.5 3.5 - 5.1 mmol/L   Chloride 98 (L) 101 - 111 mmol/L   CO2 23 22 - 32 mmol/L   Glucose, Bld 130 (H) 65 - 99 mg/dL   BUN 23 (H) 6 - 20 mg/dL   Creatinine, Ser 1.55 (H) 0.61 - 1.24 mg/dL   Calcium 8.8 (L) 8.9 - 10.3 mg/dL   Total Protein 7.6 6.5 - 8.1 g/dL   Albumin 4.2 3.5 - 5.0 g/dL   AST 32 15 - 41 U/L   ALT 21 17 - 63 U/L   Alkaline Phosphatase 70 38  - 126 U/L   Total Bilirubin 0.5 0.3 - 1.2 mg/dL   GFR calc non Af Amer 41 (L) >60 mL/min   GFR calc Af Amer 48 (L) >60 mL/min   Anion gap 8 5 - 15  CBC     Status: Abnormal   Collection Time: 01/05/16  4:35 PM  Result Value Ref Range   WBC 5.1 3.8 - 10.6 K/uL   RBC 5.19 4.40 - 5.90 MIL/uL   Hemoglobin 11.0 (L) 13.0 - 18.0 g/dL   HCT 33.8 (L) 40.0 - 52.0 %   MCV 65.1 (L) 80.0 - 100.0 fL   MCH 21.2 (L) 26.0 - 34.0 pg   MCHC 32.6 32.0 - 36.0 g/dL   RDW 16.9 (H) 11.5 - 14.5 %   Platelets 203 150 - 440 K/uL   ____________________________________________ ____________________________________________   PROCEDURES  Procedure(s) performed: none    Critical Care performed: no ____________________________________________   INITIAL IMPRESSION / ASSESSMENT AND PLAN / ED COURSE  Pertinent labs & imaging results that were available during my care of the patient were reviewed by me and considered in my medical decision making (see chart for details).  DDX: Orthostasis, dehydration, electrolyte abnormality, hypoglycemia, UTI, anemia  Ross Mccann is a 75 y.o. who presents to the ED with orthostasis as well as laboratory out abnormalities suggesting mild hyponatremia without any acidosis or hypoglycemia. Patient without leukocytosis. Has stable anemia. No report of any melena or hematochezia. Do not feel is consistent with GI bleed. He denies any chest pain or shortness of breath. His neuro exam is nonfocal and this is not consistent with CVA. Do not feel CT imaging clinically indicated as his symptoms are easily reproduced with rapid standing and is more consistent with orthostasis.  The patient will be placed on continuous pulse oximetry and telemetry for monitoring.  Laboratory evaluation will be sent to evaluate for the above complaints.     Clinical Course  Comment By Time  Urine is without any evidence of infection. He has no leukocytosis and no lactic acidosis. Patient was  significant improvement in symptoms after IV fluids. He is no longer orthostatic. He has no lightheadedness. I do feel that his symptoms are secondary to overhydration with isotonic fluids at home have instructed the patient to drink Gatorade or something with other electrolytes supplementations. Patient is tolerating oral hydration here in the ER. Do not feel additional antibiotics indicated at this time. Patient stable for discharge with outpatient follow-up for repeat BMP.  Have discussed with the patient and available family all diagnostics and treatments performed thus far and all questions were answered to the best of my ability. The patient demonstrates understanding and agreement with plan.  Merlyn Lot, MD 09/18 2358     ____________________________________________   FINAL CLINICAL IMPRESSION(S) / ED DIAGNOSES  Final diagnoses:  Nausea  Orthostasis      NEW MEDICATIONS STARTED DURING THIS VISIT:  New Prescriptions   No medications on file     Note:  This document was prepared using Dragon voice recognition software and may include unintentional dictation errors.    Merlyn Lot, MD 01/06/16 [phone]

## 2016-01-05 NOTE — ED Triage Notes (Signed)
Fever and vomiting that began today. Pt being treated for UTI X 1 week, Cipro. Pt alert and oriented X4, active, cooperative, pt in NAD. RR even and unlabored, color WNL.

## 2016-01-06 NOTE — Discharge Instructions (Signed)
Please follow-up with your primary care physician for repeat blood check in the next day or 2. He should drink plenty of fluids and make sure you're getting supplemental electrolytes as well. Return for worsening symptoms or high fevers.

## 2016-01-06 NOTE — ED Notes (Signed)
Discharge instructions reviewed with patient. Questions fielded by this RN. Patient verbalizes understanding of instructions. Patient discharged home in stable condition per Quentin Cornwall MD . No acute distress noted at time of discharge.

## 2016-01-10 LAB — CULTURE, BLOOD (ROUTINE X 2)
CULTURE: NO GROWTH
Culture: NO GROWTH

## 2016-05-31 ENCOUNTER — Other Ambulatory Visit (HOSPITAL_COMMUNITY): Payer: Self-pay | Admitting: Urology

## 2016-05-31 DIAGNOSIS — C61 Malignant neoplasm of prostate: Secondary | ICD-10-CM

## 2016-06-08 ENCOUNTER — Ambulatory Visit (HOSPITAL_COMMUNITY)
Admission: RE | Admit: 2016-06-08 | Discharge: 2016-06-08 | Disposition: A | Discharge status: Home / Self Care | Payer: Medicare HMO | Source: Ambulatory Visit | Attending: Urology | Admitting: Urology

## 2016-06-08 DIAGNOSIS — C61 Malignant neoplasm of prostate: Secondary | ICD-10-CM

## 2016-06-08 LAB — POCT I-STAT CREATININE: CREATININE: 1.1 mg/dL (ref 0.61–1.24)

## 2016-06-08 MED ORDER — LIDOCAINE HCL 2 % EX GEL: 1.0000 "application " | Freq: Once | CUTANEOUS | Status: DC

## 2016-06-08 MED ORDER — GADOBENATE DIMEGLUMINE 529 MG/ML IV SOLN
15.0000 mL | Freq: Once | INTRAVENOUS | Status: AC | PRN
  Administered 2016-06-08: 14 mL via INTRAVENOUS

## 2016-06-08 MED ORDER — LIDOCAINE HCL 2 % EX GEL
CUTANEOUS | Status: AC
  Filled 2016-06-08: qty 30

## 2016-07-05 ENCOUNTER — Other Ambulatory Visit: Payer: Self-pay | Admitting: Urology

## 2016-07-05 DIAGNOSIS — C61 Malignant neoplasm of prostate: Secondary | ICD-10-CM

## 2016-07-16 ENCOUNTER — Ambulatory Visit
Admission: RE | Admit: 2016-07-16 | Discharge: 2016-07-16 | Disposition: A | Discharge status: Home / Self Care | Payer: Medicare HMO | Source: Ambulatory Visit | Attending: Urology | Admitting: Urology

## 2016-07-16 DIAGNOSIS — C61 Malignant neoplasm of prostate: Secondary | ICD-10-CM | POA: Diagnosis present

## 2016-07-16 DIAGNOSIS — R937 Abnormal findings on diagnostic imaging of other parts of musculoskeletal system: Secondary | ICD-10-CM | POA: Insufficient documentation

## 2016-07-16 MED ORDER — TECHNETIUM TC 99M MEDRONATE IV KIT
25.0000 | PACK | Freq: Once | INTRAVENOUS | Status: AC | PRN
  Administered 2016-07-16: 24.17 via INTRAVENOUS

## 2017-01-19 ENCOUNTER — Ambulatory Visit
Admission: RE | Admit: 2017-01-19 | Payer: Medicare HMO | Source: Ambulatory Visit | Admitting: Unknown Physician Specialty

## 2017-01-19 ENCOUNTER — Encounter: Admission: RE | Payer: Self-pay | Source: Ambulatory Visit

## 2017-01-19 SURGERY — COLONOSCOPY WITH PROPOFOL
Anesthesia: General

## 2017-05-24 ENCOUNTER — Other Ambulatory Visit: Payer: Self-pay | Admitting: Urology

## 2017-06-16 ENCOUNTER — Other Ambulatory Visit: Payer: Self-pay | Admitting: Urology

## 2017-06-16 DIAGNOSIS — R972 Elevated prostate specific antigen [PSA]: Secondary | ICD-10-CM

## 2017-06-16 DIAGNOSIS — C61 Malignant neoplasm of prostate: Secondary | ICD-10-CM

## 2017-06-21 ENCOUNTER — Ambulatory Visit
Admission: RE | Admit: 2017-06-21 | Discharge: 2017-06-21 | Disposition: A | Discharge status: Home / Self Care | Payer: Medicare HMO | Source: Ambulatory Visit | Attending: Urology | Admitting: Urology

## 2017-06-21 DIAGNOSIS — R935 Abnormal findings on diagnostic imaging of other abdominal regions, including retroperitoneum: Secondary | ICD-10-CM | POA: Insufficient documentation

## 2017-06-21 DIAGNOSIS — C61 Malignant neoplasm of prostate: Secondary | ICD-10-CM | POA: Insufficient documentation

## 2017-06-21 DIAGNOSIS — R972 Elevated prostate specific antigen [PSA]: Secondary | ICD-10-CM | POA: Diagnosis present

## 2017-06-21 MED ORDER — AXUMIN (FLUCICLOVINE F 18) INJECTION
10.4500 | Freq: Once | INTRAVENOUS | Status: AC
  Administered 2017-06-21: 10.45 via INTRAVENOUS

## 2017-10-19 DIAGNOSIS — I5032 Chronic diastolic (congestive) heart failure: Secondary | ICD-10-CM | POA: Diagnosis present

## 2018-03-30 ENCOUNTER — Encounter: Payer: Self-pay | Admitting: Emergency Medicine

## 2018-03-30 ENCOUNTER — Other Ambulatory Visit: Payer: Self-pay

## 2018-03-30 ENCOUNTER — Observation Stay
Admission: EM | Admit: 2018-03-30 | Discharge: 2018-03-30 | Disposition: A | Discharge status: Home / Self Care | Payer: Medicare HMO | Source: Home / Self Care | Attending: Rehabilitative and Restorative Service Providers" | Admitting: Rehabilitative and Restorative Service Providers"

## 2018-03-30 ENCOUNTER — Emergency Department: Payer: Medicare HMO

## 2018-03-30 ENCOUNTER — Observation Stay
Admission: EM | Admit: 2018-03-30 | Discharge: 2018-03-31 | Disposition: A | Discharge status: Home / Self Care | Payer: Medicare HMO | Attending: Internal Medicine | Admitting: Internal Medicine

## 2018-03-30 DIAGNOSIS — Z87891 Personal history of nicotine dependence: Secondary | ICD-10-CM | POA: Insufficient documentation

## 2018-03-30 DIAGNOSIS — I482 Chronic atrial fibrillation, unspecified: Secondary | ICD-10-CM | POA: Diagnosis not present

## 2018-03-30 DIAGNOSIS — M8580 Other specified disorders of bone density and structure, unspecified site: Secondary | ICD-10-CM | POA: Insufficient documentation

## 2018-03-30 DIAGNOSIS — Z791 Long term (current) use of non-steroidal anti-inflammatories (NSAID): Secondary | ICD-10-CM | POA: Insufficient documentation

## 2018-03-30 DIAGNOSIS — I4891 Unspecified atrial fibrillation: Secondary | ICD-10-CM | POA: Diagnosis present

## 2018-03-30 DIAGNOSIS — N189 Chronic kidney disease, unspecified: Secondary | ICD-10-CM | POA: Diagnosis not present

## 2018-03-30 DIAGNOSIS — I200 Unstable angina: Secondary | ICD-10-CM

## 2018-03-30 DIAGNOSIS — R7989 Other specified abnormal findings of blood chemistry: Secondary | ICD-10-CM | POA: Diagnosis present

## 2018-03-30 DIAGNOSIS — Z79899 Other long term (current) drug therapy: Secondary | ICD-10-CM | POA: Diagnosis not present

## 2018-03-30 DIAGNOSIS — Z8546 Personal history of malignant neoplasm of prostate: Secondary | ICD-10-CM | POA: Insufficient documentation

## 2018-03-30 DIAGNOSIS — E785 Hyperlipidemia, unspecified: Secondary | ICD-10-CM | POA: Insufficient documentation

## 2018-03-30 DIAGNOSIS — Z7901 Long term (current) use of anticoagulants: Secondary | ICD-10-CM | POA: Insufficient documentation

## 2018-03-30 DIAGNOSIS — Z87442 Personal history of urinary calculi: Secondary | ICD-10-CM | POA: Diagnosis not present

## 2018-03-30 DIAGNOSIS — I351 Nonrheumatic aortic (valve) insufficiency: Secondary | ICD-10-CM | POA: Insufficient documentation

## 2018-03-30 DIAGNOSIS — I129 Hypertensive chronic kidney disease with stage 1 through stage 4 chronic kidney disease, or unspecified chronic kidney disease: Secondary | ICD-10-CM | POA: Insufficient documentation

## 2018-03-30 DIAGNOSIS — R918 Other nonspecific abnormal finding of lung field: Secondary | ICD-10-CM | POA: Insufficient documentation

## 2018-03-30 DIAGNOSIS — Z8249 Family history of ischemic heart disease and other diseases of the circulatory system: Secondary | ICD-10-CM | POA: Insufficient documentation

## 2018-03-30 DIAGNOSIS — M109 Gout, unspecified: Secondary | ICD-10-CM | POA: Insufficient documentation

## 2018-03-30 DIAGNOSIS — R778 Other specified abnormalities of plasma proteins: Secondary | ICD-10-CM

## 2018-03-30 DIAGNOSIS — R079 Chest pain, unspecified: Secondary | ICD-10-CM | POA: Diagnosis present

## 2018-03-30 LAB — CBC
HEMATOCRIT: 38.3 % — AB (ref 39.0–52.0)
Hemoglobin: 11.7 g/dL — ABNORMAL LOW (ref 13.0–17.0)
MCH: 20.7 pg — ABNORMAL LOW (ref 26.0–34.0)
MCHC: 30.5 g/dL (ref 30.0–36.0)
MCV: 67.8 fL — ABNORMAL LOW (ref 80.0–100.0)
PLATELETS: 203 10*3/uL (ref 150–400)
RBC: 5.65 MIL/uL (ref 4.22–5.81)
RDW: 15.6 % — ABNORMAL HIGH (ref 11.5–15.5)
WBC: 6.6 10*3/uL (ref 4.0–10.5)
nRBC: 0 % (ref 0.0–0.2)

## 2018-03-30 LAB — TROPONIN I
Troponin I: 0.05 ng/mL (ref <0.03)
Troponin I: 0.05 ng/mL (ref <0.03)
Troponin I: 0.05 ng/mL (ref <0.03)
Troponin I: 0.05 ng/mL (ref <0.03)

## 2018-03-30 LAB — ECHOCARDIOGRAM COMPLETE
Height: 67 in
Weight: 2633.6 oz

## 2018-03-30 LAB — HEMOGLOBIN A1C
Hgb A1c MFr Bld: 5.9 % — ABNORMAL HIGH (ref 4.8–5.6)
Mean Plasma Glucose: 122.63 mg/dL

## 2018-03-30 LAB — TSH: TSH: 1.514 u[IU]/mL (ref 0.350–4.500)

## 2018-03-30 LAB — BASIC METABOLIC PANEL
Anion gap: 8 (ref 5–15)
BUN: 22 mg/dL (ref 8–23)
CO2: 26 mmol/L (ref 22–32)
CREATININE: 1.05 mg/dL (ref 0.61–1.24)
Calcium: 9.7 mg/dL (ref 8.9–10.3)
Chloride: 104 mmol/L (ref 98–111)
GFR calc Af Amer: >60 mL/min (ref >60)
GFR calc non Af Amer: >60 mL/min (ref >60)
Glucose, Bld: 104 mg/dL — ABNORMAL HIGH (ref 70–99)
Potassium: 3.9 mmol/L (ref 3.5–5.1)
Sodium: 138 mmol/L (ref 135–145)

## 2018-03-30 LAB — BRAIN NATRIURETIC PEPTIDE: B Natriuretic Peptide: 376 pg/mL — ABNORMAL HIGH (ref 0.0–100.0)

## 2018-03-30 MED ORDER — APIXABAN 5 MG PO TABS
5.0000 mg | ORAL_TABLET | Freq: Two times a day (BID) | ORAL | Status: DC
  Administered 2018-03-30 – 2018-03-31 (×3): 5 mg via ORAL
  Filled 2018-03-30 (×3): qty 1

## 2018-03-30 MED ORDER — ACETAMINOPHEN 650 MG RE SUPP: 650.0000 mg | Freq: Four times a day (QID) | RECTAL | Status: DC | PRN

## 2018-03-30 MED ORDER — ONDANSETRON HCL 4 MG/2ML IJ SOLN: 4.0000 mg | Freq: Four times a day (QID) | INTRAMUSCULAR | Status: DC | PRN

## 2018-03-30 MED ORDER — SIMVASTATIN 20 MG PO TABS
40.0000 mg | ORAL_TABLET | ORAL | Status: DC
  Administered 2018-03-30 – 2018-03-31 (×2): 40 mg via ORAL
  Filled 2018-03-30 (×2): qty 2

## 2018-03-30 MED ORDER — ASPIRIN 81 MG PO CHEW
324.0000 mg | CHEWABLE_TABLET | Freq: Once | ORAL | Status: AC
  Administered 2018-03-30: 324 mg via ORAL
  Filled 2018-03-30: qty 4

## 2018-03-30 MED ORDER — DOCUSATE SODIUM 100 MG PO CAPS
100.0000 mg | ORAL_CAPSULE | Freq: Two times a day (BID) | ORAL | Status: DC
  Administered 2018-03-30: 100 mg via ORAL
  Filled 2018-03-30 (×2): qty 1

## 2018-03-30 MED ORDER — NITROGLYCERIN 2 % TD OINT
0.5000 [in_us] | TOPICAL_OINTMENT | Freq: Once | TRANSDERMAL | Status: AC
  Administered 2018-03-30: 0.5 [in_us] via TOPICAL
  Filled 2018-03-30: qty 1

## 2018-03-30 MED ORDER — RAMIPRIL 10 MG PO CAPS
10.0000 mg | ORAL_CAPSULE | Freq: Two times a day (BID) | ORAL | Status: DC
  Filled 2018-03-30 (×2): qty 1

## 2018-03-30 MED ORDER — BICALUTAMIDE 50 MG PO TABS
50.0000 mg | ORAL_TABLET | Freq: Every day | ORAL | Status: DC
  Administered 2018-03-30 – 2018-03-31 (×2): 50 mg via ORAL
  Filled 2018-03-30 (×3): qty 1

## 2018-03-30 MED ORDER — ONDANSETRON HCL 4 MG PO TABS: 4.0000 mg | ORAL_TABLET | Freq: Four times a day (QID) | ORAL | Status: DC | PRN

## 2018-03-30 MED ORDER — ACETAMINOPHEN 325 MG PO TABS: 650.0000 mg | ORAL_TABLET | Freq: Four times a day (QID) | ORAL | Status: DC | PRN

## 2018-03-30 NOTE — ED Triage Notes (Signed)
Patient ambulatory to triage with steady gait, without difficulty or distress noted; pt reports awoke 68min PTA with left sided CP, nonradiating accomp by weakness; denies hx of same

## 2018-03-30 NOTE — Progress Notes (Signed)
*  PRELIMINARY RESULTS* Echocardiogram 2D Echocardiogram has been performed.  Ross Mccann 03/30/2018, 12:10 PM

## 2018-03-30 NOTE — H&P (Signed)
Ross Mccann is an 77 y.o. male.   Chief Complaint: Chest pain HPI: The patient with past medical history of atrial fibrillation, hypertension and prostate cancer who is currently undergoing treatment presents to the emergency department with chest pain.  The pain woke him from sleep.  It was located under his left breast and did not radiate into his arm or neck.  The patient admits to some shortness of breath but denies nausea, vomiting or diaphoresis.  His pain resolved by the time he arrived at the emergency department. The patient denies feeling palpitations at any time. Telemetry showed atrial fibrillation with bradycardia.  Laboratory evaluation significant for mildly elevated troponin.  He was given aspirin and nitroglycerin prior to the emergency department staff calling hospitalist service for admission.  Past Medical History:  Diagnosis Date  . Atrial fibrillation ([place])   . Cancer [facility])    prostate  . Chronic kidney disease    H/O KIDNEY STONES  . Dysrhythmia   . Gout   . Hyperlipidemia   . Hypertension   . Prostate cancer [facility])     Past Surgical History:  Procedure Laterality Date  . COLONOSCOPY    . HEMORROIDECTOMY    . HOLMIUM LASER APPLICATION N/A 9/37/9024   Procedure: HOLMIUM LASER APPLICATION;  Surgeon: Royston Cowper, MD;  Location: [facility] ORS;  Service: Urology;  Laterality: N/A;  . INSERTION PROSTATE RADIATION SEED    . LITHOTRIPSY    . TRANSURETHRAL RESECTION OF PROSTATE N/A 12/02/2015   Procedure: TRANSURETHRAL RESECTION OF THE PROSTATE (TURP);  Surgeon: Royston Cowper, MD;  Location: [facility] ORS;  Service: Urology;  Laterality: N/A;  . VASECTOMY      Family History  Problem Relation Age of Onset  . Heart failure Sister        d. age 84   Social History:  reports that he quit smoking about 39 years ago. His smoking use included cigarettes. He has a 20.00 pack-year smoking history. He has never used smokeless tobacco. He reports current alcohol use of about  7.0 standard drinks of alcohol per week. He reports that he does not use drugs.  Allergies: No Known Allergies  Medications Prior to Admission  Medication Sig Dispense Refill  . acetaminophen (TYLENOL) 325 MG tablet Take 650 mg by mouth every 6 (six) hours as needed.    Marland Kitchen apixaban (ELIQUIS) 5 MG TABS tablet Take 5 mg by mouth 2 (two) times daily.    . folic acid (FOLVITE) 097 MCG tablet Take 800 mcg by mouth daily.    . hyoscyamine (LEVSIN/SL) 0.125 MG SL tablet Place 1 tablet (0.125 mg total) under the tongue every 4 (four) hours as needed. 1-2 TABS 40 tablet 3  . meloxicam (MOBIC) 15 MG tablet Take 15 mg by mouth daily as needed for pain.     . Multiple Vitamin (MULTIVITAMIN WITH MINERALS) TABS tablet Take 1 tablet by mouth 3 (three) times a week.    . ramipril (ALTACE) 10 MG capsule Take 10 mg by mouth 2 (two) times daily.    . simvastatin (ZOCOR) 40 MG tablet Take 40 mg by mouth every morning.       Results for orders placed or performed during the hospital encounter of 03/30/18 (from the past 48 hour(s))  Basic metabolic panel     Status: Abnormal   Collection Time: 03/30/18  3:24 AM  Result Value Ref Range   Sodium 138 135 - 145 mmol/L   Potassium 3.9 3.5 - 5.1  mmol/L   Chloride 104 98 - 111 mmol/L   CO2 26 22 - 32 mmol/L   Glucose, Bld 104 (H) 70 - 99 mg/dL   BUN 22 8 - 23 mg/dL   Creatinine, Ser 1.05 0.61 - 1.24 mg/dL   Calcium 9.7 8.9 - 10.3 mg/dL   GFR calc non Af Amer >60 >60 mL/min   GFR calc Af Amer >60 >60 mL/min   Anion gap 8 5 - 15    Comment: Performed at [facility], [address] [place], [place] [address]  CBC     Status: Abnormal   Collection Time: 03/30/18  3:24 AM  Result Value Ref Range   WBC 6.6 4.0 - 10.5 K/uL   RBC 5.65 4.22 - 5.81 MIL/uL   Hemoglobin 11.7 (L) 13.0 - 17.0 g/dL   HCT 38.3 (L) 39.0 - 52.0 %   MCV 67.8 (L) 80.0 - 100.0 fL   MCH 20.7 (L) 26.0 - 34.0 pg   MCHC 30.5 30.0 - 36.0 g/dL   RDW 15.6 (H) 11.5 - 15.5 %   Platelets  203 150 - 400 K/uL   nRBC 0.0 0.0 - 0.2 %    Comment: Performed at [facility], [address] [place], [place] [address]  Troponin I - ONCE - STAT     Status: Abnormal   Collection Time: 03/30/18  3:24 AM  Result Value Ref Range   Troponin I 0.05 (HH) <0.03 ng/mL    Comment: CRITICAL RESULT CALLED TO, READ BACK BY AND VERIFIED WITH MICHELLE MORTON@[id number]  03/30/18 [facility] Performed at [facility], [address] [place], [place] [address]   Brain natriuretic peptide     Status: Abnormal   Collection Time: 03/30/18  3:24 AM  Result Value Ref Range   B Natriuretic Peptide 376.0 (H) 0.0 - 100.0 pg/mL    Comment: Performed at [facility], [address], [place], [place] [address]   Dg Chest Portable 1 View  Result Date: 03/30/2018 CLINICAL DATA:  Left chest pain and weakness. EXAM: PORTABLE CHEST 1 VIEW COMPARISON:  Report dated 10/26/2017. FINDINGS: Enlarged cardiac silhouette. Clear lungs. The lungs are hyperexpanded with mild peribronchial thickening. Diffuse osteopenia. IMPRESSION: Cardiomegaly and mild changes of COPD and chronic bronchitis. Electronically Signed   By: Claudie Revering M.D.   On: 03/30/2018 03:42    Review of Systems  Constitutional: Negative for chills and fever.  HENT: Negative for sore throat and tinnitus.   Eyes: Negative for blurred vision and redness.  Respiratory: Negative for cough and shortness of breath.   Cardiovascular: Positive for chest pain. Negative for palpitations, orthopnea and PND.  Gastrointestinal: Negative for abdominal pain, diarrhea, nausea and vomiting.  Genitourinary: Negative for dysuria, frequency and urgency.  Musculoskeletal: Negative for joint pain and myalgias.  Skin: Negative for rash.       No lesions  Neurological: Negative for speech change, focal weakness and weakness.  Endo/Heme/Allergies: Does not bruise/bleed easily.       No temperature intolerance  Psychiatric/Behavioral: Negative for  depression and suicidal ideas.    Blood pressure 114/67, pulse (!) 53, temperature (!) 97.4 F (36.3 C), temperature source Oral, resp. rate 18, height 5\' 7"  (1.702 m), weight 72.6 kg, SpO2 100 %. Physical Exam  Vitals reviewed. Constitutional: He is oriented to person, place, and time. He appears well-developed and well-nourished. No distress.  HENT:  Head: Normocephalic and atraumatic.  Mouth/Throat: Oropharynx is clear and moist.  Eyes: Pupils are equal, round,  and reactive to light. Conjunctivae and EOM are normal. No scleral icterus.  Neck: Normal range of motion. Neck supple. No JVD present. No tracheal deviation present. No thyromegaly present.  Cardiovascular: Normal rate and regular rhythm. Exam reveals no gallop and no friction rub.  Murmur heard. Respiratory: Effort normal and breath sounds normal. No respiratory distress.  GI: Soft. Bowel sounds are normal. He exhibits no distension. There is no abdominal tenderness.  Genitourinary:    Genitourinary Comments: Deferred   Musculoskeletal: Normal range of motion.        General: No edema.  Lymphadenopathy:    He has no cervical adenopathy.  Neurological: He is alert and oriented to person, place, and time. No cranial nerve deficit.  Skin: Skin is warm and dry. No rash noted. No erythema.  Psychiatric: He has a normal mood and affect. His behavior is normal. Judgment and thought content normal.     Assessment/Plan This is a 77 year old male admitted for chest pain. 1.  Chest pain: The patient has not had pain like this before.  Unclear if related to atrial fibrillation although there is no documented tachycardia to lead to demand ischemia.  EKG shows no ischemia.  Continue to follow cardiac biomarkers and monitor telemetry.  Consult cardiology. 2.  Atrial fibrillation: Rate controlled; continue Eliquis 3.  Hypertension: Controlled; continue ramipril 4.  Hyperlipidemia: Continue statin therapy 5.  DVT prophylaxis: Therapeutic  anticoagulation 6.  GI prophylaxis: None The patient is a full code.  Time spent on admission orders and patient care approximately 45 minutes  Harrie Foreman, MD 03/30/2018, 7:09 AM

## 2018-03-30 NOTE — Care Management Note (Signed)
Case Management Note  Patient Details  Name: TANNON PEERSON MRN: [id number] Date of Birth: [birth year]  Subjective/Objective:    Patient admitted with chest pain and shortness of breath.  History of atrial fibrillation, on Eliquis.  Mild elevated troponin's.  Independent in all adls, denies issues accessing medical care, obtaining medications or with transportation.  Current with PCP.  No discharge needs identified at present by care manager or members of care team                  Action/Plan:   Expected Discharge Date:                  Expected Discharge Plan:  Home/Self Care  In-House Referral:     Discharge planning Services  CM Consult  Post Acute Care Choice:    Choice offered to:     DME Arranged:    DME Agency:     HH Arranged:    [place] Agency:     Status of Service:  Completed, signed off  If discussed at [employer], dates discussed:    Additional Comments:  Elza Rafter, RN 03/30/2018, 4:10 PM

## 2018-03-30 NOTE — ED Notes (Signed)
X-ray at bedside

## 2018-03-30 NOTE — Care Management Obs Status (Signed)
[facility] NOTIFICATION   Patient Details  Name: Ross Mccann MRN: [id number] Date of Birth: 1940/07/15   Medicare Observation Status Notification Given:  Yes    Elza Rafter, RN 03/30/2018, 4:08 PM

## 2018-03-30 NOTE — Progress Notes (Signed)
Patient arrived on unit @ approximately [id number]. No acute distress noted. Skin assessment performed with Yasmin, RN. Skin intact. Received a call from [place] shortly after reporting that patient had had a 2.30 second pause @ 0644 and a HR of 37 (non-sustained). Patient denied pain and discomfort. Shortly after, CCMD called again and reported another pause of 2.41 @ 0701. Again, patient remained asymptomatic.  Dr Brett Albino was notified.

## 2018-03-30 NOTE — Plan of Care (Signed)
  Problem: Activity: Goal: Ability to tolerate increased activity will improve Outcome: Progressing   

## 2018-03-30 NOTE — Progress Notes (Signed)
Patient admitted early this morning and seen and examined by me later in the morning.  Doing well.  States that his chest pain has completely resolved.  No concerns.  On exam, he has regular rate and rhythm.  Normal work of breathing and lungs clear to auscultation bilaterally.  -Agree with admission H&P -Trend troponins -Cardiology to see -ECHO  Hyman Bible, MD [facility]

## 2018-03-30 NOTE — ED Notes (Signed)
Admitting md at the bedside.  

## 2018-03-30 NOTE — ED Notes (Signed)
ED Provider at bedside. 

## 2018-03-30 NOTE — Consult Note (Signed)
[facility] Cardiology  CARDIOLOGY CONSULT NOTE  Patient ID: Ross Mccann MRN: [id number] DOB/AGE: [birth year] 77 y.o.  Admit date: 03/30/2018 Referring Physician Dr. Marcille Blanco Primary Physician Dr. Ginette Pitman Primary Cardiologist Dr. Saralyn Pilar Reason for Consultation Chest pain, history of atrial fibrillation  HPI: Ross Mccann is a 77 year old male with a past medical history significant for atrial fibrillation, on Eliquis, moderate aortic stenosis, hypertension, hyperlipidemia, and prostate cancer who presented to the ED on 03/30/18 with a stabbing, mid sternal chest pain with associated shortness of breath and lightheadedness. No radiating factors. Pain resolved within 30-45 minutes, prior to taking nitroglycerin which was given in the ED.  ED workup included chest xray revealing cardiomegaly and mild changes of COPD, troponin .05, .05 respectively, and ECG revealing atrial fibrillation with controlled ventricular response, rate 60bpm. Patient is currently asymptomatic and has had no recurrence of chest pain or shortness of breath.  Admits to occasional lower extremity swelling with gout flares, but none currently.  Denies syncope/presyncope. Denies N/V/D.  Does admit to a 5-6 month history of worsening exertional shortness of breath.  Activity level has been severely decreased.   Followed by Dr. Saralyn Pilar in outpatient cardiology at [facility]. Echocardiogram in June 2019 revealed severe aortic valve calcification with moderate aortic stenosis with a peak velocity of 2.63m/s, peak gradient of 31, mean gradient of 21 and aortic valve area of .9cm2. There was mild AI, mild to moderate MR, and moderate to severe TR.  LV function was normal with an EF estimated between 55-60%. Stress test in June 2019 revealed no evidence of ischemia.   Review of systems complete and found to be negative unless listed above     Past Medical History:  Diagnosis Date  . Atrial fibrillation ([place])   . Cancer [facility])     prostate  . Chronic kidney disease    H/O KIDNEY STONES  . Dysrhythmia   . Gout   . Hyperlipidemia   . Hypertension   . Prostate cancer [facility])     Past Surgical History:  Procedure Laterality Date  . COLONOSCOPY    . HEMORROIDECTOMY    . HOLMIUM LASER APPLICATION N/A 3/79/0240   Procedure: HOLMIUM LASER APPLICATION;  Surgeon: Royston Cowper, MD;  Location: [facility] ORS;  Service: Urology;  Laterality: N/A;  . INSERTION PROSTATE RADIATION SEED    . LITHOTRIPSY    . TRANSURETHRAL RESECTION OF PROSTATE N/A 12/02/2015   Procedure: TRANSURETHRAL RESECTION OF THE PROSTATE (TURP);  Surgeon: Royston Cowper, MD;  Location: [facility] ORS;  Service: Urology;  Laterality: N/A;  . VASECTOMY      Medications Prior to Admission  Medication Sig Dispense Refill Last Dose  . acetaminophen (TYLENOL) 325 MG tablet Take 650 mg by mouth every 6 (six) hours as needed.     Marland Kitchen apixaban (ELIQUIS) 5 MG TABS tablet Take 5 mg by mouth 2 (two) times daily.   9/73/5329  . folic acid (FOLVITE) 924 MCG tablet Take 800 mcg by mouth daily.   11/29/2015  . hyoscyamine (LEVSIN/SL) 0.125 MG SL tablet Place 1 tablet (0.125 mg total) under the tongue every 4 (four) hours as needed. 1-2 TABS 40 tablet 3   . meloxicam (MOBIC) 15 MG tablet Take 15 mg by mouth daily as needed for pain.      . Multiple Vitamin (MULTIVITAMIN WITH MINERALS) TABS tablet Take 1 tablet by mouth 3 (three) times a week.   11/29/2015  . ramipril (ALTACE) 10 MG capsule Take 10 mg by  mouth 2 (two) times daily.   12/02/2015 at 0800  . simvastatin (ZOCOR) 40 MG tablet Take 40 mg by mouth every morning.    12/02/2015 at 0800   Social History   Socioeconomic History  . Marital status: Married    Spouse name: Not on file  . Number of children: Not on file  . Years of education: Not on file  . Highest education level: Not on file  Occupational History  . Not on file  Social Needs  . Financial resource strain: Not on file  . Food insecurity:    Worry: Not on file     Inability: Not on file  . Transportation needs:    Medical: Not on file    Non-medical: Not on file  Tobacco Use  . Smoking status: Former Smoker    Packs/day: 1.00    Years: 20.00    Pack years: 20.00    Types: Cigarettes    Last attempt to quit: 09/18/1978    Years since quitting: 39.5  . Smokeless tobacco: Never Used  Substance and Sexual Activity  . Alcohol use: Yes    Alcohol/week: 7.0 standard drinks    Types: 7 Cans of beer per week    Comment: 1 BEER DAY  . Drug use: No  . Sexual activity: Not on file  Lifestyle  . Physical activity:    Days per week: Not on file    Minutes per session: Not on file  . Stress: Not on file  Relationships  . Social connections:    Talks on phone: Not on file    Gets together: Not on file    Attends religious service: Not on file    Active member of club or organization: Not on file    Attends meetings of clubs or organizations: Not on file    Relationship status: Not on file  . Intimate partner violence:    Fear of current or ex partner: Not on file    Emotionally abused: Not on file    Physically abused: Not on file    Forced sexual activity: Not on file  Other Topics Concern  . Not on file  Social History Narrative  . Not on file    Family History  Problem Relation Age of Onset  . Heart failure Sister        d. age 55      Review of systems complete and found to be negative unless listed above      PHYSICAL EXAM  General: Well developed, well nourished, in no acute distress HEENT:  Normocephalic and atramatic Neck:  No JVD.  Lungs: On supplemental O2. Clear bilaterally to auscultation and percussion. Heart: Irregularly irregular, rate controlled. 3/6 systolic murmur appreciated.  Abdomen: Bowel sounds are positive, abdomen soft and non-tender  Msk:  Back normal.  Normal strength and tone for age. Gait not assessed Extremities: No clubbing, cyanosis or edema.   Neuro: Alert and oriented X 3. Psych:  Good  affect, responds appropriately  Labs:   Lab Results  Component Value Date   WBC 6.6 03/30/2018   HGB 11.7 (L) 03/30/2018   HCT 38.3 (L) 03/30/2018   MCV 67.8 (L) 03/30/2018   PLT 203 03/30/2018    Recent Labs  Lab 03/30/18 0324  NA 138  K 3.9  CL 104  CO2 26  BUN 22  CREATININE 1.05  CALCIUM 9.7  GLUCOSE 104*   Lab Results  Component Value Date   TROPONINI 0.05 (  HH) 03/30/2018   No results found for: CHOL No results found for: HDL No results found for: LDLCALC No results found for: TRIG No results found for: CHOLHDL No results found for: LDLDIRECT    Radiology: Dg Chest Portable 1 View  Result Date: 03/30/2018 CLINICAL DATA:  Left chest pain and weakness. EXAM: PORTABLE CHEST 1 VIEW COMPARISON:  Report dated 10/26/2017. FINDINGS: Enlarged cardiac silhouette. Clear lungs. The lungs are hyperexpanded with mild peribronchial thickening. Diffuse osteopenia. IMPRESSION: Cardiomegaly and mild changes of COPD and chronic bronchitis. Electronically Signed   By: Claudie Revering M.D.   On: 03/30/2018 03:42    EKG: Atrial fibrillation with controlled ventricular response   ASSESSMENT AND PLAN:  1.  Moderate aortic stenosis   -Will obtain echocardiogram to further assess progression 2.  Atrial fibrillation   -Continue Eliquis 5mg  twice daily for anticoagulation   -With controlled rate, will remain off of rate-lowering medications  3.  Chest pain, elevated troponin   -Patient currently asymptomatic; will trend third troponin   -Will likely consider stress test in outpatient setting   -Echocardiogram ordered to assess aortic stenosis 4.  Hypertension   -Continue ramipril 10mg  twice daily  5.  Hyperlipidemia   -Continue simvastatin 40mg    The history, physical exam findings, and plan of care were all discussed with Dr. Bartholome Bill, and all decision making was made in collaboration.   Signed: Avie Arenas PA-C 03/30/2018, 11:25 AM

## 2018-03-30 NOTE — ED Notes (Signed)
Date and time results received: 03/30/18 04:06 (use smartphrase ".now" to insert current time)  Test: troponin Critical Value: 0.05  Name of Provider Notified: Dr. Beather Arbour  Orders Received? Or Actions Taken?: notified

## 2018-03-30 NOTE — ED Provider Notes (Signed)
[facility] Emergency Department Provider Note   ____________________________________________   First MD Initiated Contact with Patient 03/30/18 [phone]     (approximate)  I have reviewed the triage vital signs and the nursing notes.   HISTORY  Chief Complaint Chest Pain    HPI Ross Mccann is a 77 y.o. male who presents to the ED from home with a chief complaint of chest pain.  Patient has a history of atrial fibrillation on Eliquis, hypertension, hyperlipidemia who was awakened approximately 45 minutes prior to arrival with left-sided chest tightness.  Tightness was nonradiating and associated with generalized weakness.  Patient denies associated diaphoresis, palpitations, nausea/vomiting or dizziness.  Denies recent fever, chills, cough, congestion, abdominal pain, dysuria, diarrhea.  Denies recent travel or trauma.   Past Medical History:  Diagnosis Date  . Atrial fibrillation ([place])   . Cancer [facility])    prostate  . Chronic kidney disease    H/O KIDNEY STONES  . Dysrhythmia   . Gout   . Hyperlipidemia   . Hypertension   . Prostate cancer ([place])     There are no active problems to display for this patient.   Past Surgical History:  Procedure Laterality Date  . COLONOSCOPY    . HEMORROIDECTOMY    . HOLMIUM LASER APPLICATION N/A 1/44/8185   Procedure: HOLMIUM LASER APPLICATION;  Surgeon: Royston Cowper, MD;  Location: [facility] ORS;  Service: Urology;  Laterality: N/A;  . INSERTION PROSTATE RADIATION SEED    . LITHOTRIPSY    . TRANSURETHRAL RESECTION OF PROSTATE N/A 12/02/2015   Procedure: TRANSURETHRAL RESECTION OF THE PROSTATE (TURP);  Surgeon: Royston Cowper, MD;  Location: [facility] ORS;  Service: Urology;  Laterality: N/A;  . VASECTOMY      Prior to Admission medications   Medication Sig Start Date End Date Taking? Authorizing Provider  acetaminophen (TYLENOL) 325 MG tablet Take 650 mg by mouth every 6 (six) hours as needed.    [provider]  apixaban (ELIQUIS) 5 MG TABS tablet Take 5 mg by mouth 2 (two) times daily.    [provider]  folic acid (FOLVITE) 631 MCG tablet Take 800 mcg by mouth daily.    [provider]  hyoscyamine (LEVSIN/SL) 0.125 MG SL tablet Place 1 tablet (0.125 mg total) under the tongue every 4 (four) hours as needed. 1-2 TABS 12/02/15   Royston Cowper, MD  meloxicam (MOBIC) 15 MG tablet Take 15 mg by mouth daily as needed for pain.     [provider]  Multiple Vitamin (MULTIVITAMIN WITH MINERALS) TABS tablet Take 1 tablet by mouth 3 (three) times a week.    [provider]  ramipril (ALTACE) 10 MG capsule Take 10 mg by mouth 2 (two) times daily.    [provider]  simvastatin (ZOCOR) 40 MG tablet Take 40 mg by mouth every morning.     [provider]    Allergies Patient has no known allergies.  No family history on file.  Social History Social History   Tobacco Use  . Smoking status: Former Smoker    Packs/day: 1.00    Years: 20.00    Pack years: 20.00    Types: Cigarettes    Last attempt to quit: 09/18/1978    Years since quitting: 39.5  . Smokeless tobacco: Never Used  Substance Use Topics  . Alcohol use: Yes    Alcohol/week: 7.0 standard drinks    Types: 7 Cans of beer per week  Comment: 1 BEER DAY  . Drug use: No    Review of Systems  Constitutional: No fever/chills Eyes: No visual changes. ENT: No sore throat. Cardiovascular: Positive for chest pain. Respiratory: Denies shortness of breath. Gastrointestinal: No abdominal pain.  No nausea, no vomiting.  No diarrhea.  No constipation. Genitourinary: Negative for dysuria. Musculoskeletal: Negative for back pain. Skin: Negative for rash. Neurological: Negative for headaches, focal weakness or numbness.   ____________________________________________   PHYSICAL EXAM:  VITAL SIGNS: ED Triage Vitals  Enc Vitals Group     BP 03/30/18 0313 (!) 141/68      Pulse Rate 03/30/18 0313 79     Resp 03/30/18 0313 20     Temp 03/30/18 0313 98.3 F (36.8 C)     Temp Source 03/30/18 0313 Oral     SpO2 03/30/18 0313 100 %     Weight 03/30/18 0308 160 lb (72.6 kg)     Height 03/30/18 0308 5\' 7"  (1.702 m)     Head Circumference --      Peak Flow --      Pain Score 03/30/18 0307 8     Pain Loc --      Pain Edu? --      Excl. in [place]? --     Constitutional: Alert and oriented. Well appearing and in no acute distress. Eyes: Conjunctivae are normal. PERRL. EOMI. Head: Atraumatic. Nose: No congestion/rhinnorhea. Mouth/Throat: Mucous membranes are moist.  Oropharynx non-erythematous. Neck: No stridor.   Cardiovascular: Normal rate, regular rhythm. Grossly normal heart sounds.  Good peripheral circulation. Respiratory: Normal respiratory effort.  No retractions. Lungs CTAB. Gastrointestinal: Soft and nontender. No distention. No abdominal bruits. No CVA tenderness. Musculoskeletal: No lower extremity tenderness nor edema.  No joint effusions. Neurologic:  Normal speech and language. No gross focal neurologic deficits are appreciated. No gait instability. Skin:  Skin is warm, dry and intact. No rash noted. Psychiatric: Mood and affect are normal. Speech and behavior are normal.  ____________________________________________   LABS (all labs ordered are listed, but only abnormal results are displayed)  Labs Reviewed  BASIC METABOLIC PANEL - Abnormal; Notable for the following components:      Result Value   Glucose, Bld 104 (*)    All other components within normal limits  CBC - Abnormal; Notable for the following components:   Hemoglobin 11.7 (*)    HCT 38.3 (*)    MCV 67.8 (*)    MCH 20.7 (*)    RDW 15.6 (*)    All other components within normal limits  TROPONIN I - Abnormal; Notable for the following components:   Troponin I 0.05 (*)    All other components within normal limits  BRAIN NATRIURETIC PEPTIDE - Abnormal; Notable for the  following components:   B Natriuretic Peptide 376.0 (*)    All other components within normal limits   ____________________________________________  EKG  ED ECG REPORT I, SUNG,JADE J, the attending physician, personally viewed and interpreted this ECG.   Date: 03/30/2018  EKG Time: 0311  Rate: 60  Rhythm: atrial fibrillation, rate 60  Axis: Normal  Intervals:none  ST&T Change: Nonspecific  ____________________________________________  RADIOLOGY  ED MD interpretation: Changes of COPD and chronic bronchitis  Official radiology report(s): Dg Chest Portable 1 View  Result Date: 03/30/2018 CLINICAL DATA:  Left chest pain and weakness. EXAM: PORTABLE CHEST 1 VIEW COMPARISON:  Report dated 10/26/2017. FINDINGS: Enlarged cardiac silhouette. Clear lungs. The lungs are hyperexpanded with mild peribronchial thickening. Diffuse osteopenia. IMPRESSION:  Cardiomegaly and mild changes of COPD and chronic bronchitis. Electronically Signed   By: Claudie Revering M.D.   On: 03/30/2018 03:42    ____________________________________________   PROCEDURES  Procedure(s) performed: None  Procedures  Critical Care performed: Yes, see critical care note(s)   CRITICAL CARE Performed by: Paulette Blanch   Total critical care time: 30 minutes  Critical care time was exclusive of separately billable procedures and treating other patients.  Critical care was necessary to treat or prevent imminent or life-threatening deterioration.  Critical care was time spent personally by me on the following activities: development of treatment plan with patient and/or surrogate as well as nursing, discussions with consultants, evaluation of patient's response to treatment, examination of patient, obtaining history from patient or surrogate, ordering and performing treatments and interventions, ordering and review of laboratory studies, ordering and review of radiographic studies, pulse oximetry and re-evaluation of  patient's condition.  ____________________________________________   INITIAL IMPRESSION / ASSESSMENT AND PLAN / ED COURSE  As part of my medical decision making, I reviewed the following data within the [place] notes reviewed and incorporated, Labs reviewed, EKG interpreted, Old chart reviewed, Radiograph reviewed, Discussed with admitting physician and Notes from prior ED visits   77 year old male with atrial fibrillation on Eliquis who presents with left-sided chest tightness. Differential diagnosis includes, but is not limited to, ACS, aortic dissection, pulmonary embolism, cardiac tamponade, pneumothorax, pneumonia, pericarditis, myocarditis, GI-related causes including esophagitis/gastritis, and musculoskeletal chest wall pain.    Will check screening lab work including troponin.  Patient is currently chest pain-free at this time.  Will administer aspirin and nitroglycerin paste.  Anticipate hospitalization.   Clinical Course as of Mar 30 522  Thu Mar 30, 2018  9038 Noted elevated troponin.  Will discuss with hospitalist to evaluate patient in the emergency department for admission.   [JS]    Clinical Course User Index [JS] Paulette Blanch, MD     ____________________________________________   FINAL CLINICAL IMPRESSION(S) / ED DIAGNOSES  Final diagnoses:  Chest pain, unspecified type  Atrial fibrillation, unspecified type ([place])  Unstable angina ([place])  Elevated troponin     ED Discharge Orders    None       Note:  This document was prepared using Dragon voice recognition software and may include unintentional dictation errors.    Paulette Blanch, MD 03/30/18 [phone]

## 2018-03-31 LAB — CBC
HCT: 34.9 % — ABNORMAL LOW (ref 39.0–52.0)
HEMOGLOBIN: 10.6 g/dL — AB (ref 13.0–17.0)
MCH: 21 pg — ABNORMAL LOW (ref 26.0–34.0)
MCHC: 30.4 g/dL (ref 30.0–36.0)
MCV: 69.1 fL — ABNORMAL LOW (ref 80.0–100.0)
Platelets: 180 10*3/uL (ref 150–400)
RBC: 5.05 MIL/uL (ref 4.22–5.81)
RDW: 15.5 % (ref 11.5–15.5)
WBC: 4.2 10*3/uL (ref 4.0–10.5)
nRBC: 0 % (ref 0.0–0.2)

## 2018-03-31 LAB — BASIC METABOLIC PANEL
Anion gap: 7 (ref 5–15)
BUN: 21 mg/dL (ref 8–23)
CHLORIDE: 107 mmol/L (ref 98–111)
CO2: 26 mmol/L (ref 22–32)
Calcium: 9.2 mg/dL (ref 8.9–10.3)
Creatinine, Ser: 0.95 mg/dL (ref 0.61–1.24)
GFR calc Af Amer: >60 mL/min (ref >60)
GFR calc non Af Amer: >60 mL/min (ref >60)
Glucose, Bld: 103 mg/dL — ABNORMAL HIGH (ref 70–99)
Potassium: 3.9 mmol/L (ref 3.5–5.1)
Sodium: 140 mmol/L (ref 135–145)

## 2018-03-31 MED ORDER — BICALUTAMIDE 50 MG PO TABS: 50.0000 mg | ORAL_TABLET | Freq: Every day | ORAL | 0 refills | Status: AC

## 2018-03-31 MED ORDER — METOPROLOL SUCCINATE ER 25 MG PO TB24: 12.5000 mg | ORAL_TABLET | Freq: Every day | ORAL | 0 refills | Status: AC

## 2018-03-31 NOTE — Discharge Instructions (Signed)
It was so nice to meet you during this hospitalization!  You came into the hospital because you were having chest pain. All of your labs looked fine and your echocardiogram was unchanged from the last time it was done.  Please keep taking 1/2 tablet of metoprolol. I have STOPPED your amlodipine because you said it was making you feel bad at home.  Take care, Dr. Brett Albino

## 2018-03-31 NOTE — Progress Notes (Signed)
Ross Mccann to be D/C'd Home per MD order.  Discussed prescriptions and follow up appointments with the patient.  Pt verbalized understanding.  Allergies as of 03/31/2018   No Known Allergies     Medication List    STOP taking these medications   ramipril 10 MG capsule Commonly known as:  ALTACE     TAKE these medications   acetaminophen 325 MG tablet Commonly known as:  TYLENOL Take 650 mg by mouth every 6 (six) hours as needed.   bicalutamide 50 MG tablet Commonly known as:  CASODEX Take 1 tablet (50 mg total) by mouth daily. Start taking on:  April 01, 2018   ELIQUIS 5 MG Tabs tablet Generic drug:  apixaban Take 5 mg by mouth 2 (two) times daily.   folic acid 876 MCG tablet Commonly known as:  FOLVITE Take 800 mcg by mouth daily.   hyoscyamine 0.125 MG SL tablet Commonly known as:  LEVSIN/SL Place 1 tablet (0.125 mg total) under the tongue every 4 (four) hours as needed. 1-2 TABS   meloxicam 15 MG tablet Commonly known as:  MOBIC Take 15 mg by mouth daily as needed for pain.   metoprolol succinate 25 MG 24 hr tablet Commonly known as:  TOPROL-XL Take 0.5 tablets (12.5 mg total) by mouth daily.   multivitamin with minerals Tabs tablet Take 1 tablet by mouth 3 (three) times a week.   simvastatin 40 MG tablet Commonly known as:  ZOCOR Take 40 mg by mouth every morning.       Vitals:   03/31/18 0410 03/31/18 0812  BP: 114/77 126/81  Pulse: 89 62  Resp: 17 18  Temp: 98.5 F (36.9 C) (!) 97.4 F (36.3 C)  SpO2: 99% 99%    Tele box removed and returned. Skin clean, dry and intact without evidence of skin break down, no evidence of skin tears noted. IV catheter discontinued intact. Site without signs and symptoms of complications. Dressing and pressure applied. Pt denies pain at this time. No complaints noted.  An After Visit Summary was printed and given to the patient. Patient escorted via [place], and D/C home via private auto.  [name]

## 2018-03-31 NOTE — Progress Notes (Signed)
[facility] Cardiology  SUBJECTIVE: Mr. Ross Mccann is a 77 year old male who presented to the ED on 03/30/18 with an episode of stabbing, mid sternal chest pain.  Chest pain resolved within 30-45 minutes, and he has had no recurrence since.  Denies palpitations or shortness of breath.  Denies lower extremity swelling. Denies syncope or presyncope. Since amlodipine and ramipril were discontinued, Mr. Ross Mccann reports overall improvement in fatigue.    Vitals:   03/30/18 1804 03/30/18 1939 03/31/18 0410 03/31/18 0812  BP:  127/89 114/77 126/81  Pulse:  62 89 62  Resp:   17   Temp:  98.1 F (36.7 C) 98.5 F (36.9 C) (!) 97.4 F (36.3 C)  TempSrc:  Oral    SpO2: 99% 99% 99% 99%  Weight:   74.5 kg   Height:         Intake/Output Summary (Last 24 hours) at 03/31/2018 0843 Last data filed at 03/31/2018 0813 Gross per 24 hour  Intake 0 ml  Output 2550 ml  Net -2550 ml      PHYSICAL EXAM  General: Well developed, well nourished, in no acute distress HEENT:  Normocephalic and atramatic Neck:  No JVD.  Lungs: Clear bilaterally to auscultation and percussion. Heart: Irregularly irregular. 3/6 systolic murmur radiating to bilateral carotids  Abdomen: Bowel sounds are positive, abdomen soft and non-tender  Msk:  Back normal.  Normal strength and tone for age. Gait not assessed Extremities: No clubbing, cyanosis or edema.   Neuro: Alert and oriented X 3. Psych:  Good affect, responds appropriately   LABS: Basic Metabolic Panel: Recent Labs    03/30/18 0324 03/31/18 0555  NA 138 140  K 3.9 3.9  CL 104 107  CO2 26 26  GLUCOSE 104* 103*  BUN 22 21  CREATININE 1.05 0.95  CALCIUM 9.7 9.2   Liver Function Tests: No results for input(s): AST, ALT, ALKPHOS, BILITOT, PROT, ALBUMIN in the last 72 hours. No results for input(s): LIPASE, AMYLASE in the last 72 hours. CBC: Recent Labs    03/30/18 0324 03/31/18 0555  WBC 6.6 4.2  HGB 11.7* 10.6*  HCT 38.3* 34.9*  MCV 67.8* 69.1*  PLT  203 180   Cardiac Enzymes: Recent Labs    03/30/18 0740 03/30/18 1516 03/30/18 1909  TROPONINI 0.05* 0.05* 0.05*   BNP: Invalid input(s): POCBNP D-Dimer: No results for input(s): DDIMER in the last 72 hours. Hemoglobin A1C: Recent Labs    03/30/18 0740  HGBA1C 5.9*   Fasting Lipid Panel: No results for input(s): CHOL, HDL, LDLCALC, TRIG, CHOLHDL, LDLDIRECT in the last 72 hours. Thyroid Function Tests: Recent Labs    03/30/18 0740  TSH 1.514   Anemia Panel: No results for input(s): VITAMINB12, FOLATE, FERRITIN, TIBC, IRON, RETICCTPCT in the last 72 hours.  Dg Chest Portable 1 View  Result Date: 03/30/2018 CLINICAL DATA:  Left chest pain and weakness. EXAM: PORTABLE CHEST 1 VIEW COMPARISON:  Report dated 10/26/2017. FINDINGS: Enlarged cardiac silhouette. Clear lungs. The lungs are hyperexpanded with mild peribronchial thickening. Diffuse osteopenia. IMPRESSION: Cardiomegaly and mild changes of COPD and chronic bronchitis. Electronically Signed   By: Claudie Revering M.D.   On: 03/30/2018 03:42     Echo: Normal LV function with EF estimated between 60 and 65%.  Aortic valve was mildly thickened, mildly calcified leaflets.  Moderate AI.  Valve area calculated at .86cm2.   TELEMETRY: Atrial fibrillation with controlled ventricular response   ASSESSMENT AND PLAN:  Active Problems:   Chest pain  1.  Moderate aortic stenosis   -No further inpatient workup needed; encouraged to follow up within 1 week with Dr. Lawerance Cruel  2.  Chest pain, elevated troponin   -Troponin .05 x 3, no recurrence of chest pain. Okay to discharge from cardiology standpoint  3.  Atrial fibrillation   -Continue Eliquis 5mg  twice daily   -Currently on no rate lowering medications; will consider resuming metoprolol succinate 25mg  daily if pressure can tolerate it 4.  Hyperlipidemia   -Continue simvastatin 40mg  daily  5.  Hypertension   -Ramipril and amlodipine discontinued due to  hypotension; okay to remain off for now   The history, physical exam findings, and plan of care were all discussed with Dr. Bartholome Bill, and all decision making was made in collaboration.   Avie Arenas, PA-C 03/31/2018 8:43 AM

## 2018-03-31 NOTE — Discharge Summary (Signed)
[place] [facility] [place] NAME: Ross Mccann    MR#:  [id number]  DATE OF BIRTH:  November 19, 1940  DATE OF ADMISSION:  03/30/2018   ADMITTING PHYSICIAN: Harrie Foreman, MD  DATE OF DISCHARGE: 03/31/2018 12:08 PM  PRIMARY CARE PHYSICIAN: Tracie Harrier, MD   ADMISSION DIAGNOSIS:  Unstable angina ([place]) [I20.0] Elevated troponin [R79.89] Atrial fibrillation, unspecified type ([place]) [I48.91] Chest pain, unspecified type [R07.9] DISCHARGE DIAGNOSIS:  Active Problems:   Chest pain  SECONDARY DIAGNOSIS:   Past Medical History:  Diagnosis Date  . Atrial fibrillation ([place])   . Cancer [facility])    prostate  . Chronic kidney disease    H/O KIDNEY STONES  . Dysrhythmia   . Gout   . Hyperlipidemia   . Hypertension   . Prostate cancer [facility])    HOSPITAL COURSE:   Rayquon is a 77 year old male who presented to the ED with left-sided chest pain that woke him up from sleep.  He was admitted for further management.  Chest pain-resolved -Troponin stable at 0.05 x 3 -Echo with EF 60 to 65% and moderate aortic regurgitation -Seen by cardiology, who did not recommend any further inpatient work-up, consider stress test as outpatient -Patient advised to follow-up with cardiology in 1 week  Chronic atrial fibrillation-rate controlled this admission -Continued home metoprolol and Eliquis  Hypertension-blood pressures well controlled -Continued metoprolol -Stopped Norvasc, as this was making him feel bad at home  Hyperlipidemia-stable -Continued home statin  DISCHARGE CONDITIONS:  Aortic regurgitation Chronic atrial fibrillation Hypertension Hyperlipidemia CONSULTS OBTAINED:  Treatment Team:  Teodoro Spray, MD DRUG ALLERGIES:  No Known Allergies DISCHARGE MEDICATIONS:   Allergies as of 03/31/2018   No Known Allergies     Medication List    STOP taking these medications   ramipril 10 MG capsule Commonly known as:  ALTACE     TAKE these medications   acetaminophen 325 MG tablet Commonly known as:  TYLENOL Take 650 mg by mouth every 6 (six) hours as needed.   bicalutamide 50 MG tablet Commonly known as:  CASODEX Take 1 tablet (50 mg total) by mouth daily. Start taking on:  April 01, 2018   ELIQUIS 5 MG Tabs tablet Generic drug:  apixaban Take 5 mg by mouth 2 (two) times daily.   folic acid 735 MCG tablet Commonly known as:  FOLVITE Take 800 mcg by mouth daily.   hyoscyamine 0.125 MG SL tablet Commonly known as:  LEVSIN/SL Place 1 tablet (0.125 mg total) under the tongue every 4 (four) hours as needed. 1-2 TABS   meloxicam 15 MG tablet Commonly known as:  MOBIC Take 15 mg by mouth daily as needed for pain.   metoprolol succinate 25 MG 24 hr tablet Commonly known as:  TOPROL-XL Take 0.5 tablets (12.5 mg total) by mouth daily.   multivitamin with minerals Tabs tablet Take 1 tablet by mouth 3 (three) times a week.   simvastatin 40 MG tablet Commonly known as:  ZOCOR Take 40 mg by mouth every morning.        DISCHARGE INSTRUCTIONS:  1.  Follow-up with PCP in 5 days 2.  Follow-up with cardiology in 1 week 3.  Stopped Norvasc as patient states this makes him feel bad DIET:  Cardiac diet DISCHARGE CONDITION:  Stable ACTIVITY:  Activity as tolerated OXYGEN:  Home Oxygen: No.  Oxygen Delivery: room air DISCHARGE LOCATION:  home   If you experience worsening of your admission symptoms, develop shortness  of breath, life threatening emergency, suicidal or homicidal thoughts you must seek medical attention immediately by calling 911 or calling your MD immediately  if symptoms less severe.  You Must read complete instructions/literature along with all the possible adverse reactions/side effects for all the Medicines you take and that have been prescribed to you. Take any new Medicines after you have completely understood and accpet all the possible adverse reactions/side effects.   Please  note  You were cared for by a hospitalist during your hospital stay. If you have any questions about your discharge medications or the care you received while you were in the hospital after you are discharged, you can call the unit and asked to speak with the hospitalist on call if the hospitalist that took care of you is not available. Once you are discharged, your primary care physician will handle any further medical issues. Please note that NO REFILLS for any discharge medications will be authorized once you are discharged, as it is imperative that you return to your primary care physician (or establish a relationship with a primary care physician if you do not have one) for your aftercare needs so that they can reassess your need for medications and monitor your lab values.    On the day of Discharge:  VITAL SIGNS:  Blood pressure 126/81, pulse 62, temperature (!) 97.4 F (36.3 C), resp. rate 18, height 5\' 7"  (1.702 m), weight 74.5 kg, SpO2 99 %. PHYSICAL EXAMINATION:  GENERAL:  77 y.o.-year-old patient lying in the bed with no acute distress.  EYES: Pupils equal, round, reactive to light and accommodation. No scleral icterus. Extraocular muscles intact.  HEENT: Head atraumatic, normocephalic. Oropharynx and nasopharynx clear.  NECK:  Supple, no jugular venous distention. No thyroid enlargement, no tenderness.  LUNGS: Normal breath sounds bilaterally, no wheezing, rales,rhonchi or crepitation. No use of accessory muscles of respiration.  CARDIOVASCULAR: Irregularly irregular rate, regular rhythm, S1, S2 normal. No rubs, or gallops. III/VI systolic murmur. ABDOMEN: Soft, non-tender, non-distended. Bowel sounds present. No organomegaly or mass.  EXTREMITIES: No pedal edema, cyanosis, or clubbing.  NEUROLOGIC: Cranial nerves II through XII are intact. Muscle strength 5/5 in all extremities. Sensation intact. Gait not checked.  PSYCHIATRIC: The patient is alert and oriented x 3.  SKIN: No  obvious rash, lesion, or ulcer.  DATA REVIEW:   CBC Recent Labs  Lab 03/31/18 0555  WBC 4.2  HGB 10.6*  HCT 34.9*  PLT 180    Chemistries  Recent Labs  Lab 03/31/18 0555  NA 140  K 3.9  CL 107  CO2 26  GLUCOSE 103*  BUN 21  CREATININE 0.95  CALCIUM 9.2     Microbiology Results  Results for orders placed or performed during the hospital encounter of 01/05/16  Blood culture (routine x 2)     Status: None   Collection Time: 01/05/16  7:58 PM  Result Value Ref Range Status   Specimen Description BLOOD  LEFT HAND  Final   Special Requests   Final    BOTTLES DRAWN AEROBIC AND ANAEROBIC  AER 4CC ANA 4CC   Culture NO GROWTH 5 DAYS  Final   Report Status 01/10/2016 FINAL  Final  Blood culture (routine x 2)     Status: None   Collection Time: 01/05/16  8:20 PM  Result Value Ref Range Status   Specimen Description BLOOD  RIGHT FOREARM  Final   Special Requests   Final    BOTTLES DRAWN AEROBIC AND ANAEROBIC  AER 4CC ANA 10CC   Culture NO GROWTH 5 DAYS  Final   Report Status 01/10/2016 FINAL  Final    RADIOLOGY:  No results found.   Management plans discussed with the patient, family and they are in agreement.  CODE STATUS: Prior   TOTAL TIME TAKING CARE OF THIS PATIENT: 40 minutes.    Berna Spare Harlen Danford M.D on 03/31/2018 at 5:57 PM  Between 7am to 6pm - Pager - [phone]  After 6pm go to [email or web address] - Proofreader  [employer] [place] [facility]  Office  [phone]  CC: Primary care physician; Tracie Harrier, MD   Note: This dictation was prepared with Dragon dictation along with smaller phrase technology. Any transcriptional errors that result from this process are unintentional.

## 2018-06-10 ENCOUNTER — Emergency Department
Admission: EM | Admit: 2018-06-10 | Discharge: 2018-06-10 | Disposition: A | Discharge status: Home / Self Care | Payer: Medicare HMO | Attending: Emergency Medicine | Admitting: Emergency Medicine

## 2018-06-10 ENCOUNTER — Emergency Department: Payer: Medicare HMO

## 2018-06-10 ENCOUNTER — Other Ambulatory Visit: Payer: Self-pay

## 2018-06-10 DIAGNOSIS — Z79899 Other long term (current) drug therapy: Secondary | ICD-10-CM | POA: Diagnosis not present

## 2018-06-10 DIAGNOSIS — I509 Heart failure, unspecified: Secondary | ICD-10-CM | POA: Diagnosis not present

## 2018-06-10 DIAGNOSIS — I4891 Unspecified atrial fibrillation: Secondary | ICD-10-CM | POA: Diagnosis not present

## 2018-06-10 DIAGNOSIS — R0600 Dyspnea, unspecified: Secondary | ICD-10-CM | POA: Diagnosis present

## 2018-06-10 DIAGNOSIS — I110 Hypertensive heart disease with heart failure: Secondary | ICD-10-CM | POA: Insufficient documentation

## 2018-06-10 DIAGNOSIS — Z87891 Personal history of nicotine dependence: Secondary | ICD-10-CM | POA: Diagnosis not present

## 2018-06-10 DIAGNOSIS — I10 Essential (primary) hypertension: Secondary | ICD-10-CM | POA: Insufficient documentation

## 2018-06-10 LAB — COMPREHENSIVE METABOLIC PANEL
ALT: 28 U/L (ref 0–44)
AST: 32 U/L (ref 15–41)
Albumin: 4.3 g/dL (ref 3.5–5.0)
Alkaline Phosphatase: 85 U/L (ref 38–126)
Anion gap: 6 (ref 5–15)
BUN: 31 mg/dL — ABNORMAL HIGH (ref 8–23)
CO2: 25 mmol/L (ref 22–32)
Calcium: 9 mg/dL (ref 8.9–10.3)
Chloride: 106 mmol/L (ref 98–111)
Creatinine, Ser: 1.34 mg/dL — ABNORMAL HIGH (ref 0.61–1.24)
GFR calc Af Amer: 58 mL/min — ABNORMAL LOW (ref >60)
GFR calc non Af Amer: 50 mL/min — ABNORMAL LOW (ref >60)
Glucose, Bld: 95 mg/dL (ref 70–99)
Potassium: 4 mmol/L (ref 3.5–5.1)
Sodium: 137 mmol/L (ref 135–145)
Total Bilirubin: 1.5 mg/dL — ABNORMAL HIGH (ref 0.3–1.2)
Total Protein: 7.1 g/dL (ref 6.5–8.1)

## 2018-06-10 LAB — CBC
HCT: 40.4 % (ref 39.0–52.0)
Hemoglobin: 12.3 g/dL — ABNORMAL LOW (ref 13.0–17.0)
MCH: 20.8 pg — ABNORMAL LOW (ref 26.0–34.0)
MCHC: 30.4 g/dL (ref 30.0–36.0)
MCV: 68.2 fL — ABNORMAL LOW (ref 80.0–100.0)
Platelets: 171 10*3/uL (ref 150–400)
RBC: 5.92 MIL/uL — ABNORMAL HIGH (ref 4.22–5.81)
RDW: 17.2 % — ABNORMAL HIGH (ref 11.5–15.5)
WBC: 4.9 10*3/uL (ref 4.0–10.5)
nRBC: 0.8 % — ABNORMAL HIGH (ref 0.0–0.2)

## 2018-06-10 LAB — BRAIN NATRIURETIC PEPTIDE: B Natriuretic Peptide: 1404 pg/mL — ABNORMAL HIGH (ref 0.0–100.0)

## 2018-06-10 LAB — TROPONIN I
Troponin I: 0.08 ng/mL (ref <0.03)
Troponin I: 0.09 ng/mL (ref <0.03)

## 2018-06-10 MED ORDER — FUROSEMIDE 20 MG PO TABS: 20.0000 mg | ORAL_TABLET | Freq: Every day | ORAL | 0 refills | Status: DC

## 2018-06-10 MED ORDER — FUROSEMIDE 10 MG/ML IJ SOLN
40.0000 mg | Freq: Once | INTRAMUSCULAR | Status: AC
  Administered 2018-06-10: 40 mg via INTRAVENOUS
  Filled 2018-06-10: qty 4

## 2018-06-10 MED ORDER — POTASSIUM CHLORIDE ER 10 MEQ PO TBCR: 10.0000 meq | EXTENDED_RELEASE_TABLET | Freq: Every day | ORAL | 0 refills | Status: DC

## 2018-06-10 NOTE — ED Notes (Signed)
Pt brought back from lobby with troponin of 0.08. hx of heart failure. 2nd ekg obtained after pt placed on monitor. Iv established. Pt comfortable and in no apparent distress.

## 2018-06-10 NOTE — ED Provider Notes (Signed)
[facility] Emergency Department Provider Note  ____________________________________________  Time seen: Approximately 11:10 PM  I have reviewed the triage vital signs and the nursing notes.   HISTORY  Chief Complaint Shortness of Breath    HPI Ross Mccann is a 78 y.o. male with a history of atrial fibrillation, prostate cancer, hypertension who comes to the ED complaining of  dyspnea on exertion for the past 4 or 5 days, gradually worsening.  Denies orthopnea but endorses PND.  Reports that he gets short of breath climbing 7 steps or walking approximately 20 feet at home.  This is happened in the past a few months ago and improved with hospitalization and diuresis.  He says that his doctor told him to take Lasix but he thought he did not need it.  Denies any chest pain.  Shortness of breath is worse with exertion, better with rest.  No vomiting diaphoresis dizziness or syncope.     Past Medical History:  Diagnosis Date  . Atrial fibrillation ([place])   . Cancer [facility])    prostate  . Chronic kidney disease    H/O KIDNEY STONES  . Dysrhythmia   . Gout   . Hyperlipidemia   . Hypertension   . Prostate cancer [facility])      Patient Active Problem List   Diagnosis Date Noted  . Chest pain 03/30/2018     Past Surgical History:  Procedure Laterality Date  . COLONOSCOPY    . HEMORROIDECTOMY    . HOLMIUM LASER APPLICATION N/A 5/36/1443   Procedure: HOLMIUM LASER APPLICATION;  Surgeon: Royston Cowper, MD;  Location: [facility] ORS;  Service: Urology;  Laterality: N/A;  . INSERTION PROSTATE RADIATION SEED    . LITHOTRIPSY    . TRANSURETHRAL RESECTION OF PROSTATE N/A 12/02/2015   Procedure: TRANSURETHRAL RESECTION OF THE PROSTATE (TURP);  Surgeon: Royston Cowper, MD;  Location: [facility] ORS;  Service: Urology;  Laterality: N/A;  . VASECTOMY       Prior to Admission medications   Medication Sig Start Date End Date Taking? Authorizing Provider  acetaminophen  (TYLENOL) 325 MG tablet Take 650 mg by mouth every 6 (six) hours as needed.    [provider]  apixaban (ELIQUIS) 5 MG TABS tablet Take 5 mg by mouth 2 (two) times daily.    [provider]  bicalutamide (CASODEX) 50 MG tablet Take 1 tablet (50 mg total) by mouth daily. 04/01/18   Mayo, Pete Pelt, MD  folic acid (FOLVITE) 154 MCG tablet Take 800 mcg by mouth daily.    [provider]  furosemide (LASIX) 20 MG tablet Take 1 tablet (20 mg total) by mouth daily for 7 days. 06/10/18 06/17/18  Carrie Mew, MD  hyoscyamine (LEVSIN/SL) 0.125 MG SL tablet Place 1 tablet (0.125 mg total) under the tongue every 4 (four) hours as needed. 1-2 TABS 12/02/15   Royston Cowper, MD  meloxicam (MOBIC) 15 MG tablet Take 15 mg by mouth daily as needed for pain.     [provider]  metoprolol succinate (TOPROL-XL) 25 MG 24 hr tablet Take 0.5 tablets (12.5 mg total) by mouth daily. 03/31/18   Mayo, Pete Pelt, MD  Multiple Vitamin (MULTIVITAMIN WITH MINERALS) TABS tablet Take 1 tablet by mouth 3 (three) times a week.    [provider]  potassium chloride (K-DUR) 10 MEQ tablet Take 1 tablet (10 mEq total) by mouth daily. 06/10/18   Carrie Mew, MD  simvastatin (ZOCOR) 40 MG tablet Take 40  mg by mouth every morning.     [provider]     Allergies Patient has no known allergies.   Family History  Problem Relation Age of Onset  . Heart failure Sister        d. age 53    Social History Social History   Tobacco Use  . Smoking status: Former Smoker    Packs/day: 1.00    Years: 20.00    Pack years: 20.00    Types: Cigarettes    Last attempt to quit: 09/18/1978    Years since quitting: 39.7  . Smokeless tobacco: Never Used  Substance Use Topics  . Alcohol use: Yes    Alcohol/week: 7.0 standard drinks    Types: 7 Cans of beer per week    Comment: 1 BEER DAY  . Drug use: No    Review of Systems  Constitutional:   No fever or chills.   ENT:   No sore throat. No rhinorrhea. Cardiovascular:   No chest pain or syncope. Respiratory:   Positive shortness of breath without cough. Gastrointestinal:   Negative for abdominal pain, vomiting and diarrhea.  Musculoskeletal:   Negative for focal pain or swelling All other systems reviewed and are negative except as documented above in ROS and HPI.  ____________________________________________   PHYSICAL EXAM:  VITAL SIGNS: ED Triage Vitals [06/10/18 1922]  Enc Vitals Group     BP (!) 154/99     Pulse Rate 76     Resp 20     Temp 98.3 F (36.8 C)     Temp Source Oral     SpO2 96 %     Weight 164 lb (74.4 kg)     Height 5\' 7"  (1.702 m)     Head Circumference      Peak Flow      Pain Score 0     Pain Loc      Pain Edu?      Excl. in [place]?     Vital signs reviewed, nursing assessments reviewed.   Constitutional:   Alert and oriented. Non-toxic appearance. Eyes:   Conjunctivae are normal. EOMI. PERRL. ENT      Head:   Normocephalic and atraumatic.      Nose:   No congestion/rhinnorhea.       Mouth/Throat:   MMM, no pharyngeal erythema. No peritonsillar mass.       Neck:   No meningismus. Full ROM.  Slightly elevated JVP. Hematological/Lymphatic/Immunilogical:   No cervical lymphadenopathy. Cardiovascular:   Irregularly irregular rhythm, rate of about 80. Symmetric bilateral radial and DP pulses.  No murmurs. Cap refill less than 2 seconds. Respiratory:   Normal respiratory effort without tachypnea/retractions. Breath sounds are clear and equal bilaterally. No wheezes/rales/rhonchi. Gastrointestinal:   Soft and nontender. Non distended. There is no CVA tenderness.  No rebound, rigidity, or guarding. Musculoskeletal:   Normal range of motion in all extremities. No joint effusions.  No lower extremity tenderness.  No edema. Neurologic:   Normal speech and language.  Motor grossly intact. No acute focal neurologic deficits are appreciated.  Skin:    Skin is warm, dry  and intact. No rash noted.  No petechiae, purpura, or bullae.  ____________________________________________    LABS (pertinent positives/negatives) (all labs ordered are listed, but only abnormal results are displayed) Labs Reviewed  CBC - Abnormal; Notable for the following components:      Result Value   RBC 5.92 (*)    Hemoglobin 12.3 (*)  MCV 68.2 (*)    MCH 20.8 (*)    RDW 17.2 (*)    nRBC 0.8 (*)    All other components within normal limits  TROPONIN I - Abnormal; Notable for the following components:   Troponin I 0.08 (*)    All other components within normal limits  COMPREHENSIVE METABOLIC PANEL - Abnormal; Notable for the following components:   BUN 31 (*)    Creatinine, Ser 1.34 (*)    Total Bilirubin 1.5 (*)    GFR calc non Af Amer 50 (*)    GFR calc Af Amer 58 (*)    All other components within normal limits  BRAIN NATRIURETIC PEPTIDE - Abnormal; Notable for the following components:   B Natriuretic Peptide 1,404.0 (*)    All other components within normal limits  TROPONIN I - Abnormal; Notable for the following components:   Troponin I 0.09 (*)    All other components within normal limits   ____________________________________________   EKG  Interpreted by me Atrial fibrillation rate of 81, normal axis and intervals.  Poor R wave progression.  Normal ST segments and T waves.  1 PVC on the strip.  No acute ischemic changes.  ____________________________________________    [id number]  Dg Chest 2 View  Result Date: 06/10/2018 CLINICAL DATA:  Shortness of Breath EXAM: CHEST - 2 VIEW COMPARISON:  03/30/2018 FINDINGS: Cardiomegaly. No confluent opacities, effusions or edema. No acute bony abnormality. IMPRESSION: Cardiomegaly.  No active disease. Electronically Signed   By: Rolm Baptise M.D.   On: 06/10/2018 20:21    ____________________________________________   PROCEDURES Procedures  ____________________________________________    CLINICAL  IMPRESSION / ASSESSMENT AND PLAN / ED COURSE  Medications ordered in the ED: Medications  furosemide (LASIX) injection 40 mg (40 mg Intravenous Given 06/10/18 2208)    Pertinent labs & imaging results that were available during my care of the patient were reviewed by me and considered in my medical decision making (see chart for details).    Patient presents with dyspnea on exertion consistent with heart failure.  Not significantly volume overloaded but symptomatic.  Chest x-ray and oxygenation are normal.  Work of breathing is normal.  Offered hospitalization for diuresis and further symptom monitoring, but patient feels comfortable managing this at home and prefers to be discharged and follow-up with his cardiologist, Dr. Saralyn Pilar.  Return precautions discussed.  Will give IV Lasix in the ED and check a serial troponin.Considering the patient's symptoms, medical history, and physical examination today, I have low suspicion for ACS, PE, TAD, pneumothorax, carditis, mediastinitis, pneumonia, or sepsis.    Clinical Course as of Jun 10 2308  Sat Jun 10, 2018  2308 Serial troponin essentially stable.  Consistent with CHF.  Patient feels better and has urinated multiple times after IV Lasix, stable for discharge home.  Troponin I(!!): 0.09 [PS]    Clinical Course User Index [PS] Carrie Mew, MD     ____________________________________________   FINAL CLINICAL IMPRESSION(S) / ED DIAGNOSES    Final diagnoses:  Acute on chronic congestive heart failure, unspecified heart failure type [facility])     ED Discharge Orders         Ordered    furosemide (LASIX) 20 MG tablet  Daily     06/10/18 2310    potassium chloride (K-DUR) 10 MEQ tablet  Daily     06/10/18 2310          Portions of this note were generated with dragon dictation software. Dictation  errors may occur despite best attempts at proofreading.   Carrie Mew, MD 06/10/18 [phone]

## 2018-06-10 NOTE — ED Triage Notes (Signed)
Pt with history of CHF. Pt states he is shob. Pt states has been shob for a week and it is progressively getting worse. Pt states is having trouble breathing lying down and feels like his ankles are increasing in swelling.

## 2018-06-10 NOTE — ED Notes (Addendum)
Pt states he has been up to the bathroom 4 times with the lasix and is feeling better.

## 2018-06-10 NOTE — ED Triage Notes (Signed)
First Nurse Note:  Arrives c/o SOB since Monday.  Also reports DOE.  Denies cough.  States has had similar symptoms last year, and was diagnosed with CHF.  Patient is AAOx3.  Skin warm and dry. NAD

## 2018-06-19 ENCOUNTER — Telehealth: Payer: Self-pay

## 2018-06-19 NOTE — Telephone Encounter (Signed)
Left message for patient to call office to schedule an appointment as we have not received a call since his ED visit.

## 2018-07-03 ENCOUNTER — Ambulatory Visit: Payer: Medicare HMO | Admitting: Family

## 2018-09-26 ENCOUNTER — Other Ambulatory Visit: Payer: Self-pay | Admitting: Urology

## 2018-09-26 DIAGNOSIS — R310 Gross hematuria: Secondary | ICD-10-CM

## 2018-10-06 ENCOUNTER — Other Ambulatory Visit: Payer: Self-pay

## 2018-10-06 ENCOUNTER — Ambulatory Visit
Admission: RE | Admit: 2018-10-06 | Discharge: 2018-10-06 | Disposition: A | Discharge status: Home / Self Care | Payer: Medicare HMO | Source: Ambulatory Visit | Attending: Urology | Admitting: Urology

## 2018-10-06 DIAGNOSIS — R310 Gross hematuria: Secondary | ICD-10-CM | POA: Diagnosis not present

## 2018-10-06 MED ORDER — IOHEXOL 300 MG/ML  SOLN
150.0000 mL | Freq: Once | INTRAMUSCULAR | Status: AC | PRN
  Administered 2018-10-06: 125 mL via INTRAVENOUS

## 2018-10-16 NOTE — H&P (Signed)
NAME: Ross Mccann, MCKEONE MEDICAL RECORD [id number] ACCOUNT [id number] DATE OF BIRTH:[birth year] FACILITY: [facility] LOCATION:  PHYSICIAN:Deepa Barthel Farrel Conners, MD  HISTORY AND PHYSICAL  DATE OF ADMISSION:  10/24/2018  Same day surgery, 10/23/2017   CHIEF COMPLAINT:  Bladder cancer.  HISTORY OF PRESENT ILLNESS:  The patient is an 78 year old Asian male who developed gross hematuria associated with urinary retention due to clots.  Upper tract evaluation with CT indicated lateral low Bosniak grade II right renal cyst.  Cystoscopy on  10/09/2018 indicated 2 papillary bladder tumors close to the left bladder neck.  The largest lesion measured 8 x 8 mm and the smaller lesion measured 3 x 3 mm.  The patient comes in now for transurethral resection of these bladder tumors and instillation  of mitomycin into the bladder.  ALLERGIES:  No drug allergies.  CURRENT MEDICATIONS:  Included Eliquis Casodex, Lasix, Lupron, Meloxicam, metoprolol, potassium chloride, psyllium and simvastatin.  PAST SURGICAL HISTORY:   1.  I-125 brachytherapy with adjuvant androgen blockade 2002. 2.  Hemorrhoidectomy 2001.   3.  Vasectomy in 1995.   4.  Transurethral resection of bladder neck contracture 2008.   5.  Transurethral resection of bladder neck contracture and bladder stone 2010.   6.  Transurethral removal of dysmorphic prostatic calcifications 2017.   7.  Colonoscopy 2003.  PAST AND CURRENT MEDICAL CONDITIONS: 1.  Atrial fibrillation. 2.  Prostate cancer with recent PSA, recurrence requiring androgen blockade. 3.  Gout. 4.  Kidney stones. 5.  Hyperlipidemia. 6.  Hypertension. 7.  Thalassemia.  REVIEW OF SYSTEMS:  The patient denies chest pain, shortness of breath, diabetes or stroke.  SOCIAL HISTORY:  The patient denies tobacco use.  He consumes alcohol only rarely.  FAMILY HISTORY:  Negative for prostate cancer or bladder cancer.  PHYSICAL EXAMINATION: GENERAL:  Well-nourished Asian male, in no  acute distress. VITAL SIGNS:  Height was 5 feet 7, weight 162. HEENT:  Sclerae were clear and nonicteric. NECK:  No palpable cervical adenopathy. PULMONARY:  Lungs clear to auscultation. CARDIOVASCULAR:  Regular rhythm and rate. ABDOMEN:  Soft, nontender abdomen. GENITOURINARY:  Circumcised.  Testes atrophic. RECTAL:  Less than 10 gram flat, smooth, nontender prostate. NEUROMUSCULAR:  Alert and oriented x3.  IMPRESSION: 1.  Bladder cancer. 2.  Prostate cancer.  PLAN:  Transurethral resection of bladder tumors and instillation of mitomycin-C into the bladder.  AN/NUANCE  D:10/13/2018 T:10/13/2018 JOB:[id number]

## 2018-10-17 ENCOUNTER — Other Ambulatory Visit: Payer: Self-pay

## 2018-10-17 ENCOUNTER — Encounter
Admission: RE | Admit: 2018-10-17 | Discharge: 2018-10-17 | Disposition: A | Discharge status: Home / Self Care | Payer: Medicare HMO | Source: Ambulatory Visit | Attending: Urology | Admitting: Urology

## 2018-10-17 DIAGNOSIS — I4891 Unspecified atrial fibrillation: Secondary | ICD-10-CM | POA: Insufficient documentation

## 2018-10-17 DIAGNOSIS — Z01818 Encounter for other preprocedural examination: Secondary | ICD-10-CM | POA: Diagnosis present

## 2018-10-17 DIAGNOSIS — I10 Essential (primary) hypertension: Secondary | ICD-10-CM | POA: Insufficient documentation

## 2018-10-17 DIAGNOSIS — Z1159 Encounter for screening for other viral diseases: Secondary | ICD-10-CM | POA: Diagnosis not present

## 2018-10-17 HISTORY — DX: Personal history of urinary calculi: Z87.442

## 2018-10-17 LAB — BASIC METABOLIC PANEL
Anion gap: 9 (ref 5–15)
BUN: 37 mg/dL — ABNORMAL HIGH (ref 8–23)
CO2: 26 mmol/L (ref 22–32)
Calcium: 9.4 mg/dL (ref 8.9–10.3)
Chloride: 103 mmol/L (ref 98–111)
Creatinine, Ser: 1.11 mg/dL (ref 0.61–1.24)
GFR calc Af Amer: >60 mL/min (ref >60)
GFR calc non Af Amer: >60 mL/min (ref >60)
Glucose, Bld: 93 mg/dL (ref 70–99)
Potassium: 3.9 mmol/L (ref 3.5–5.1)
Sodium: 138 mmol/L (ref 135–145)

## 2018-10-17 LAB — CBC
HCT: 37.7 % — ABNORMAL LOW (ref 39.0–52.0)
Hemoglobin: 11.6 g/dL — ABNORMAL LOW (ref 13.0–17.0)
MCH: 21.9 pg — ABNORMAL LOW (ref 26.0–34.0)
MCHC: 30.8 g/dL (ref 30.0–36.0)
MCV: 71.1 fL — ABNORMAL LOW (ref 80.0–100.0)
Platelets: 178 10*3/uL (ref 150–400)
RBC: 5.3 MIL/uL (ref 4.22–5.81)
RDW: 17.5 % — ABNORMAL HIGH (ref 11.5–15.5)
WBC: 4 10*3/uL (ref 4.0–10.5)
nRBC: 0.5 % — ABNORMAL HIGH (ref 0.0–0.2)

## 2018-10-17 LAB — PROTIME-INR
INR: 1.2 (ref 0.8–1.2)
Prothrombin Time: 15.4 seconds — ABNORMAL HIGH (ref 11.4–15.2)

## 2018-10-17 LAB — APTT: aPTT: 34 seconds (ref 24–36)

## 2018-10-17 NOTE — Patient Instructions (Signed)
  Your procedure is scheduled on:  Tuesday October 24, 2018 Report to Same Day Surgery 2nd floor [facility] Entrance-take elevator on left to 2nd floor.  Check in with surgery information desk.) To find out your arrival time, call [phone] 1:00-3:00 PM on Monday October 23, 2018  Remember: Instructions that are not followed completely may result in serious medical risk, up to and including death, or upon the discretion of your surgeon and anesthesiologist your surgery may need to be rescheduled.    __x__ 1. Do not eat food (including mints, candies, chewing gum) after midnight the night before your procedure. You may drink clear liquids up to 2 hours before you are scheduled to arrive at the hospital for your procedure.  Do not drink anything within 2 hours of your scheduled arrival to the hospital.  Approved clear liquids:  --Water or Apple juice without pulp  --Clear carbohydrate beverage such as Gatorade or Powerade  --Black Coffee or Clear Tea (No milk, no creamers, do not add anything to the coffee or tea)    __x__ 2. No Alcohol for 24 hours before or after surgery.   __x__ 3. No Smoking or e-cigarettes for 24 hours before surgery.  Do not use any chewable tobacco products for at least 6 hours before surgery.   __x__ 4. Notify your doctor if there is any change in your medical condition (cold, fever, infections).   __x__ 5. On the morning of surgery brush your teeth with toothpaste and water.  You may rinse your mouth with mouthwash if you wish.  Do not swallow any toothpaste or mouthwash.   __x__ Use antibacterial soap such as Dial to shower/bathe on the day of surgery.   Do not wear jewelry, lotions, powders, deodorant or perfumes on the day of surgery.  Do not bring valuables to the hospital.    [facility] is not responsible for any belongings or valuables.               Glasses, dentures or bridgework may not be worn into surgery.  For patients admitted to the  hospital, discharge time is determined by your treatment team.  For patients discharged on the day of surgery, you will NOT be permitted to drive yourself home.  You must have a responsible adult with you for 24 hours after surgery.  __x__ Take these medicines on the morning of surgery with a SMALL SIP OF WATER:  1. Metoprolol/Toprol  2. Simvastatin/Zocor  Skip your Furosemide (Lasix) only on the morning of surgery.  __x__ Follow recommendations from Cardiologist, Pulmonologist or PCP regarding stopping Aspirin, Coumadin, Plavix, Eliquis, Effient, Pradaxa, and Pletal.   Stop your Eliquis 3 days prior to surgery (Last dose Friday October 20, 2018)  __x__ TODAY: Stop Anti-inflammatories such as Meloxicam (Mobic), Advil, Ibuprofen, Motrin, Aleve, Naproxen, Naprosyn, BC/Goodies powders or aspirin products. You may take Tylenol if needed for pain.   __x__ TODAY: Do not start any new over the counter supplements.  You may continue to take your multi-vitamin, Folic Acid, and Potassium.

## 2018-10-18 NOTE — Pre-Procedure Instructions (Signed)
EKG OK BY DR Randa Lynn 10/17/18

## 2018-10-20 ENCOUNTER — Other Ambulatory Visit
Admission: RE | Admit: 2018-10-20 | Discharge: 2018-10-20 | Disposition: A | Discharge status: Home / Self Care | Payer: Medicare HMO | Source: Ambulatory Visit | Attending: Urology | Admitting: Urology

## 2018-10-20 ENCOUNTER — Other Ambulatory Visit: Payer: Self-pay

## 2018-10-20 DIAGNOSIS — Z01818 Encounter for other preprocedural examination: Secondary | ICD-10-CM | POA: Diagnosis not present

## 2018-10-20 LAB — SARS CORONAVIRUS 2 (TAT 6-24 HRS): SARS Coronavirus 2: NEGATIVE

## 2018-10-23 MED ORDER — CEFAZOLIN SODIUM-DEXTROSE 1-4 GM/50ML-% IV SOLN
1.0000 g | Freq: Once | INTRAVENOUS | Status: AC
  Administered 2018-10-24: 1 g via INTRAVENOUS

## 2018-10-24 ENCOUNTER — Ambulatory Visit
Admission: RE | Admit: 2018-10-24 | Discharge: 2018-10-24 | Disposition: A | Discharge status: Home / Self Care | Payer: Medicare HMO | Source: Ambulatory Visit | Attending: Urology | Admitting: Urology

## 2018-10-24 ENCOUNTER — Other Ambulatory Visit: Payer: Self-pay

## 2018-10-24 ENCOUNTER — Ambulatory Visit: Payer: Medicare HMO | Admitting: Anesthesiology

## 2018-10-24 ENCOUNTER — Encounter
Admission: RE | Disposition: A | Discharge status: Home / Self Care | Payer: Self-pay | Source: Ambulatory Visit | Attending: Urology

## 2018-10-24 DIAGNOSIS — M109 Gout, unspecified: Secondary | ICD-10-CM | POA: Diagnosis not present

## 2018-10-24 DIAGNOSIS — N3289 Other specified disorders of bladder: Secondary | ICD-10-CM | POA: Insufficient documentation

## 2018-10-24 DIAGNOSIS — E785 Hyperlipidemia, unspecified: Secondary | ICD-10-CM | POA: Diagnosis not present

## 2018-10-24 DIAGNOSIS — I4891 Unspecified atrial fibrillation: Secondary | ICD-10-CM | POA: Insufficient documentation

## 2018-10-24 DIAGNOSIS — Z7901 Long term (current) use of anticoagulants: Secondary | ICD-10-CM | POA: Diagnosis not present

## 2018-10-24 DIAGNOSIS — Z87891 Personal history of nicotine dependence: Secondary | ICD-10-CM | POA: Diagnosis not present

## 2018-10-24 DIAGNOSIS — Z87442 Personal history of urinary calculi: Secondary | ICD-10-CM | POA: Insufficient documentation

## 2018-10-24 DIAGNOSIS — R339 Retention of urine, unspecified: Secondary | ICD-10-CM | POA: Diagnosis not present

## 2018-10-24 DIAGNOSIS — N3081 Other cystitis with hematuria: Secondary | ICD-10-CM | POA: Insufficient documentation

## 2018-10-24 DIAGNOSIS — I351 Nonrheumatic aortic (valve) insufficiency: Secondary | ICD-10-CM | POA: Diagnosis not present

## 2018-10-24 DIAGNOSIS — D494 Neoplasm of unspecified behavior of bladder: Secondary | ICD-10-CM | POA: Insufficient documentation

## 2018-10-24 DIAGNOSIS — I10 Essential (primary) hypertension: Secondary | ICD-10-CM | POA: Insufficient documentation

## 2018-10-24 DIAGNOSIS — Z79899 Other long term (current) drug therapy: Secondary | ICD-10-CM | POA: Insufficient documentation

## 2018-10-24 DIAGNOSIS — D569 Thalassemia, unspecified: Secondary | ICD-10-CM | POA: Diagnosis not present

## 2018-10-24 DIAGNOSIS — Z791 Long term (current) use of non-steroidal anti-inflammatories (NSAID): Secondary | ICD-10-CM | POA: Insufficient documentation

## 2018-10-24 DIAGNOSIS — Z8546 Personal history of malignant neoplasm of prostate: Secondary | ICD-10-CM | POA: Diagnosis not present

## 2018-10-24 HISTORY — PX: TRANSURETHRAL RESECTION OF BLADDER TUMOR WITH MITOMYCIN-C: SHX6459

## 2018-10-24 SURGERY — TRANSURETHRAL RESECTION OF BLADDER TUMOR WITH MITOMYCIN-C
Anesthesia: General | Site: Bladder

## 2018-10-24 MED ORDER — PROMETHAZINE HCL 25 MG/ML IJ SOLN: 6.2500 mg | INTRAMUSCULAR | Status: DC | PRN

## 2018-10-24 MED ORDER — EPHEDRINE SULFATE 50 MG/ML IJ SOLN
INTRAMUSCULAR | Status: DC | PRN
  Administered 2018-10-24: 10 mg via INTRAVENOUS

## 2018-10-24 MED ORDER — FAMOTIDINE 20 MG PO TABS
ORAL_TABLET | ORAL | Status: AC
  Administered 2018-10-24: 14:00:00 20 mg via ORAL
  Filled 2018-10-24: qty 1

## 2018-10-24 MED ORDER — MIDAZOLAM HCL 2 MG/2ML IJ SOLN
INTRAMUSCULAR | Status: AC
  Filled 2018-10-24: qty 2

## 2018-10-24 MED ORDER — FENTANYL CITRATE (PF) 100 MCG/2ML IJ SOLN
INTRAMUSCULAR | Status: AC
  Filled 2018-10-24: qty 2

## 2018-10-24 MED ORDER — BELLADONNA ALKALOIDS-OPIUM 16.2-60 MG RE SUPP
RECTAL | Status: AC
  Filled 2018-10-24: qty 1

## 2018-10-24 MED ORDER — DEXAMETHASONE SODIUM PHOSPHATE 10 MG/ML IJ SOLN
INTRAMUSCULAR | Status: DC | PRN
  Administered 2018-10-24: 5 mg via INTRAVENOUS

## 2018-10-24 MED ORDER — LIDOCAINE HCL URETHRAL/MUCOSAL 2 % EX GEL
CUTANEOUS | Status: DC | PRN
  Administered 2018-10-24: 1 via URETHRAL

## 2018-10-24 MED ORDER — ACETAMINOPHEN 325 MG PO TABS: 325.0000 mg | ORAL_TABLET | ORAL | Status: DC | PRN

## 2018-10-24 MED ORDER — URIBEL 118 MG PO CAPS: 1.0000 | ORAL_CAPSULE | Freq: Four times a day (QID) | ORAL | 3 refills | Status: DC | PRN

## 2018-10-24 MED ORDER — FAMOTIDINE 20 MG PO TABS
20.0000 mg | ORAL_TABLET | Freq: Once | ORAL | Status: AC
  Administered 2018-10-24: 14:00:00 20 mg via ORAL

## 2018-10-24 MED ORDER — BELLADONNA ALKALOIDS-OPIUM 16.2-30 MG RE SUPP
RECTAL | Status: DC | PRN
  Administered 2018-10-24: 60 mg via RECTAL

## 2018-10-24 MED ORDER — ROCURONIUM BROMIDE 50 MG/5ML IV SOLN
INTRAVENOUS | Status: AC
  Filled 2018-10-24: qty 1

## 2018-10-24 MED ORDER — FENTANYL CITRATE (PF) 100 MCG/2ML IJ SOLN: 25.0000 ug | INTRAMUSCULAR | Status: DC | PRN

## 2018-10-24 MED ORDER — FENTANYL CITRATE (PF) 100 MCG/2ML IJ SOLN
INTRAMUSCULAR | Status: DC | PRN
  Administered 2018-10-24: 25 ug via INTRAVENOUS

## 2018-10-24 MED ORDER — MEPERIDINE HCL 50 MG/ML IJ SOLN: 6.2500 mg | INTRAMUSCULAR | Status: DC | PRN

## 2018-10-24 MED ORDER — MITOMYCIN CHEMO FOR BLADDER INSTILLATION 40 MG
INTRAVENOUS | Status: DC | PRN
  Administered 2018-10-24: 40 mg via INTRAVESICAL

## 2018-10-24 MED ORDER — LACTATED RINGERS IV SOLN
INTRAVENOUS | Status: DC
  Administered 2018-10-24: 14:00:00 via INTRAVENOUS

## 2018-10-24 MED ORDER — ACETAMINOPHEN 160 MG/5ML PO SOLN: 325.0000 mg | ORAL | Status: DC | PRN

## 2018-10-24 MED ORDER — CEFAZOLIN SODIUM-DEXTROSE 1-4 GM/50ML-% IV SOLN
INTRAVENOUS | Status: AC
  Filled 2018-10-24: qty 50

## 2018-10-24 MED ORDER — LIDOCAINE HCL (PF) 1 % IJ SOLN
INTRAMUSCULAR | Status: AC
  Filled 2018-10-24: qty 5

## 2018-10-24 MED ORDER — ONDANSETRON HCL 4 MG/2ML IJ SOLN
INTRAMUSCULAR | Status: DC | PRN
  Administered 2018-10-24: 4 mg via INTRAVENOUS

## 2018-10-24 MED ORDER — PROPOFOL 10 MG/ML IV BOLUS
INTRAVENOUS | Status: DC | PRN
  Administered 2018-10-24: 150 mg via INTRAVENOUS
  Administered 2018-10-24: 50 mg via INTRAVENOUS

## 2018-10-24 MED ORDER — HYDROCODONE-ACETAMINOPHEN 7.5-325 MG PO TABS: 1.0000 | ORAL_TABLET | Freq: Once | ORAL | Status: DC | PRN

## 2018-10-24 MED ORDER — CIPROFLOXACIN HCL 500 MG PO TABS: 500.0000 mg | ORAL_TABLET | Freq: Two times a day (BID) | ORAL | 0 refills | Status: DC

## 2018-10-24 MED ORDER — LIDOCAINE HCL (PF) 2 % IJ SOLN
INTRAMUSCULAR | Status: AC
  Filled 2018-10-24: qty 10

## 2018-10-24 MED ORDER — DEXAMETHASONE SODIUM PHOSPHATE 10 MG/ML IJ SOLN
INTRAMUSCULAR | Status: AC
  Filled 2018-10-24: qty 1

## 2018-10-24 MED ORDER — PROPOFOL 10 MG/ML IV BOLUS
INTRAVENOUS | Status: AC
  Filled 2018-10-24: qty 20

## 2018-10-24 MED ORDER — ONDANSETRON HCL 4 MG/2ML IJ SOLN
INTRAMUSCULAR | Status: AC
  Filled 2018-10-24: qty 2

## 2018-10-24 MED ORDER — LIDOCAINE HCL (CARDIAC) PF 100 MG/5ML IV SOSY
PREFILLED_SYRINGE | INTRAVENOUS | Status: DC | PRN
  Administered 2018-10-24: 100 mg via INTRAVENOUS

## 2018-10-24 MED ORDER — PHENYLEPHRINE HCL (PRESSORS) 10 MG/ML IV SOLN
INTRAVENOUS | Status: DC | PRN
  Administered 2018-10-24: 100 ug via INTRAVENOUS

## 2018-10-24 MED ORDER — EPHEDRINE SULFATE 50 MG/ML IJ SOLN
INTRAMUSCULAR | Status: AC
  Filled 2018-10-24: qty 1

## 2018-10-24 MED ORDER — DEXAMETHASONE SODIUM PHOSPHATE 4 MG/ML IJ SOLN
INTRAMUSCULAR | Status: AC
  Filled 2018-10-24: qty 1

## 2018-10-24 SURGICAL SUPPLY — 21 items
BAG DRAIN CYSTO-URO LG1000N (MISCELLANEOUS) ×4 IMPLANT
BAG URINE DRAINAGE (UROLOGICAL SUPPLIES) ×4 IMPLANT
CATH FOLEY 2WAY  5CC 20FR SIL (CATHETERS) ×2
CATH FOLEY 2WAY 5CC 20FR SIL (CATHETERS) ×2 IMPLANT
DRSG TELFA 4X3 1S NADH ST (GAUZE/BANDAGES/DRESSINGS) ×4 IMPLANT
ELECT LOOP 22F BIPOLAR SML (ELECTROSURGICAL) ×4
ELECTRODE LOOP 22F BIPOLAR SML (ELECTROSURGICAL) IMPLANT
GLOVE BIO SURGEON STRL SZ7.5 (GLOVE) ×6 IMPLANT
GOWN STRL REUS W/ TWL LRG LVL4 (GOWN DISPOSABLE) ×2 IMPLANT
GOWN STRL REUS W/ TWL XL LVL3 (GOWN DISPOSABLE) ×2 IMPLANT
GOWN STRL REUS W/TWL LRG LVL4 (GOWN DISPOSABLE) ×4
GOWN STRL REUS W/TWL XL LVL3 (GOWN DISPOSABLE) ×4
KIT TURNOVER CYSTO (KITS) ×4 IMPLANT
LOOP CUT BIPOLAR 24F LRG (ELECTROSURGICAL) ×2 IMPLANT
PACK CYSTO AR (MISCELLANEOUS) ×4 IMPLANT
SET IRRIG Y TYPE TUR BLADDER L (SET/KITS/TRAYS/PACK) ×8 IMPLANT
SOL .9 NS 3000ML IRR  AL (IV SOLUTION) ×8
SOL .9 NS 3000ML IRR AL (IV SOLUTION) ×8
SOL .9 NS 3000ML IRR UROMATIC (IV SOLUTION) ×8 IMPLANT
SYRINGE IRR TOOMEY STRL 70CC (SYRINGE) ×4 IMPLANT
WATER STERILE IRR 1000ML POUR (IV SOLUTION) ×4 IMPLANT

## 2018-10-24 NOTE — Discharge Instructions (Addendum)
Bladder Biopsy, Care After Refer to this sheet in the next few weeks. These instructions provide you with information about caring for yourself after your procedure. Your health care provider may also give you more specific instructions. Your treatment has been planned according to current medical practices, but problems sometimes occur. Call your health care provider if you have any problems or questions after your procedure. What can I expect after the procedure? After the procedure, it is common to have:  Mild pain in your bladder or kidney area during urination.  Minor burning during urination.  Small amounts of blood in your urine.  A sudden urge to urinate.  A need to urinate more often than usual. Follow these instructions at home: Medicines  Take over-the-counter and prescription medicines only as told by your health care provider.  If you were prescribed an antibiotic medicine, take it as told by your health care provider. Do not stop taking the antibiotic even if you start to feel better. General instructions   Take a warm bath to relieve any burning sensations around your urethra.  Hold a warm, damp washcloth over the urethral area to ease pain.  Return to your normal activities as told by your health care provider. Ask your health care provider what activities are safe for you.  Do not drive for 24 hours if you received a medicine to help you relax (sedative) during your procedure. Ask your health care provider when it is safe for you to drive.  It is your responsibility to get the results of your procedure. Ask your health care provider or the department performing the procedure when your results will be ready.  Keep all follow-up visits as told by your health care provider. This is important. Contact a health care provider if:  You have a fever.  Your symptoms do not improve within 24 hours and you continue to have: ? Burning during urination. ? Increasing  amounts of blood in your urine. ? Pain during urination. ? An urgent need to urinate. ? A need to urinate more often than usual. Get help right away if:  You have a lot of bleeding or more bleeding.  You have severe pain.  You are unable to urinate.  You have bright red blood in your urine.  You are passing blood clots in your urine.  You have a fever.  You have swelling, redness, or pain in your legs.  You have difficulty breathing. This information is not intended to replace advice given to you by your health care provider. Make sure you discuss any questions you have with your health care provider. Document Released: 04/22/2015 Document Revised: 09/11/2015 Document Reviewed: 04/22/2015 Elsevier Patient Education  2020 [employer]   1) The drugs that you were given will stay in your system until tomorrow so for the next 24 hours you should not:  A) Drive an automobile B) Make any legal decisions C) Drink any alcoholic beverage   2) You may resume regular meals tomorrow.  Today it is better to start with liquids and gradually work up to solid foods.  You may eat anything you prefer, but it is better to start with liquids, then soup and crackers, and gradually work up to solid foods.   3) Please notify your doctor immediately if you have any unusual bleeding, trouble breathing, redness and pain at the surgery site, drainage, fever, or pain not relieved by medication.    4)  Additional Instructions: ° ° ° ° ° ° ° °Please contact your physician with any problems or Same Day Surgery at [phone], Monday through Friday 6 am to 4 pm, or [place] [facility] [place] Main number at [phone]. ° ° ° ° °

## 2018-10-24 NOTE — Anesthesia Post-op Follow-up Note (Signed)
Anesthesia QCDR form completed.        

## 2018-10-24 NOTE — H&P (Signed)
Date of Initial H&P: 10/13/2018  History reviewed, patient examined, no change in status, stable for surgery.

## 2018-10-24 NOTE — Transfer of Care (Signed)
Immediate Anesthesia Transfer of Care Note  Patient: Ross Mccann  Procedure(s) Performed: TRANSURETHRAL RESECTION OF BLADDER TUMOR WITH MITOMYCIN-C (N/A Bladder)  Patient Location: PACU  Anesthesia Type:General  Level of Consciousness: sedated  Airway & Oxygen Therapy: Patient Spontanous Breathing and Patient connected to face mask oxygen  Post-op Assessment: Report given to RN and Post -op Vital signs reviewed and stable  Post vital signs: Reviewed and stable  Last Vitals:  Vitals Value Taken Time  BP 123/75 10/24/18 1628  Temp 36.3 C 10/24/18 1628  Pulse 57 10/24/18 1628  Resp 8 10/24/18 1628  SpO2 100 % 10/24/18 1628  Vitals shown include unvalidated device data.  Last Pain:  Vitals:   10/24/18 1628  TempSrc:   PainSc: 0-No pain         Complications: No apparent anesthesia complications

## 2018-10-24 NOTE — Anesthesia Postprocedure Evaluation (Signed)
Anesthesia Post Note  Patient: Ross Mccann  Procedure(s) Performed: TRANSURETHRAL RESECTION OF BLADDER TUMOR WITH MITOMYCIN-C (N/A Bladder)  Patient location during evaluation: PACU Anesthesia Type: General Level of consciousness: awake and alert Pain management: pain level controlled Vital Signs Assessment: post-procedure vital signs reviewed and stable Respiratory status: spontaneous breathing, nonlabored ventilation, respiratory function stable and patient connected to nasal cannula oxygen Cardiovascular status: blood pressure returned to baseline and stable Postop Assessment: no apparent nausea or vomiting Anesthetic complications: no     Last Vitals:  Vitals:   10/24/18 1734 10/24/18 1747  BP: (!) 183/71 (!) 172/74  Pulse: 73 66  Resp: 16 16  Temp: 36.4 C   SpO2: 99% 100%    Last Pain:  Vitals:   10/24/18 1747  TempSrc:   PainSc: 0-No pain                 Molli Barrows

## 2018-10-24 NOTE — Anesthesia Procedure Notes (Signed)
Procedure Name: LMA Insertion Date/Time: 10/24/2018 3:49 PM Performed by: Hedda Slade, CRNA Pre-anesthesia Checklist: Patient identified, Patient being monitored, Timeout performed, Emergency Drugs available and Suction available Patient Re-evaluated:Patient Re-evaluated prior to induction Oxygen Delivery Method: Circle system utilized Preoxygenation: Pre-oxygenation with 100% oxygen Induction Type: IV induction Ventilation: Mask ventilation without difficulty LMA: LMA inserted LMA Size: 4.5 Tube type: Oral Number of attempts: 1 Placement Confirmation: positive ETCO2 and breath sounds checked- equal and bilateral Tube secured with: Tape Dental Injury: Teeth and Oropharynx as per pre-operative assessment

## 2018-10-24 NOTE — Op Note (Signed)
Preoperative diagnosis: 1.  Bladder tumors                                             2.  Gross hematuria  Postoperative diagnosis: Same  Procedure: 1.  Transurethral resection of bladder tumors                      2.  Instillation of mitomycin-C into the bladder  Surgeon: Otelia Limes. Yves Dill MD  Anesthesia: General  Indications:See the history and physical. After informed consent the above procedure(s) were requested     Technique and findings: After adequate general anesthesia been obtained patient was placed into dorsal lithotomy position and the perineum was then prepped and draped in the usual fashion.  A 24 French resectoscope sheath was then coupled to the camera and visually advanced into the bladder.  Both ureteral orifice ease were identified and had clear efflux.  There were two polypoid lesions just lateral to the left orifice.  Using the loop electrode the lesions were removed and fragments sent to pathology.  The base of the resection sites were cauterized.  The resectoscope was then removed and 10 cc of viscous Xylocaine instilled within the urethra.  A 20 French silicone catheter was placed.  40 mg of mitomycin-C was then instilled into the bladder via the catheter and the catheter was clamped.  A B&O suppository was placed.  Procedure was then terminated and patient transferred to recovery room in stable condition.  Blood loss was minimal.

## 2018-10-24 NOTE — Anesthesia Preprocedure Evaluation (Addendum)
Anesthesia Evaluation  Patient identified by MRN, date of birth, ID band Patient awake    Reviewed: Allergy & Precautions, H&P , NPO status , reviewed documented beta blocker date and time   Airway Mallampati: II  TM Distance: >3 FB Neck ROM: full    Dental  (+) Chipped, Missing, Caps   Pulmonary former smoker,    Pulmonary exam normal        Cardiovascular hypertension, + dysrhythmias Atrial Fibrillation  Rhythm:irregular  ECHO 03/2018 Study Conclusions  - Left ventricle: Systolic function was normal. The estimated   ejection fraction was in the range of 60% to 65%. - Aortic valve: There was moderate regurgitation. Valve area (VTI):   0.86 cm^2. Valve area (Vmax): 0.94 cm^2. - Mitral valve: Valve area by continuity equation (using LVOT   flow): 2.53 cm^2. - Left atrium: The atrium was mildly dilated. - Right ventricle: The cavity size was mildly dilated. - Pulmonic valve: Peak gradient (S): 12 mm Hg.   Neuro/Psych    GI/Hepatic neg GERD  ,  Endo/Other    Renal/GU Renal disease     Musculoskeletal   Abdominal   Peds  Hematology   Anesthesia Other Findings Past Medical History: No date: Atrial fibrillation ([place]) No date: Cancer ([place])     Comment:  prostate No date: Chronic kidney disease     Comment:  H/O KIDNEY STONES No date: Dysrhythmia No date: Gout No date: History of kidney stones No date: Hyperlipidemia No date: Hypertension No date: Prostate cancer [facility]) Past Surgical History: No date: COLONOSCOPY No date: HEMORROIDECTOMY 12/02/2015: HOLMIUM LASER APPLICATION; N/A     Comment:  Procedure: HOLMIUM LASER APPLICATION;  Surgeon: Royston Cowper, MD;  Location: [facility] ORS;  Service: Urology;                Laterality: N/A; No date: INSERTION PROSTATE RADIATION SEED No date: LITHOTRIPSY 12/02/2015: TRANSURETHRAL RESECTION OF PROSTATE; N/A     Comment:  Procedure: TRANSURETHRAL RESECTION  OF THE PROSTATE               (TURP);  Surgeon: Royston Cowper, MD;  Location: [facility]               ORS;  Service: Urology;  Laterality: N/A; No date: VASECTOMY   Reproductive/Obstetrics                            Anesthesia Physical Anesthesia Plan  ASA: III  Anesthesia Plan: General   Post-op Pain Management:    Induction: Intravenous  PONV Risk Score and Plan: Ondansetron and Treatment may vary due to age or medical condition  Airway Management Planned: LMA  Additional Equipment:   Intra-op Plan:   Post-operative Plan: Extubation in OR  Informed Consent: I have reviewed the patients History and Physical, chart, labs and discussed the procedure including the risks, benefits and alternatives for the proposed anesthesia with the patient or authorized representative who has indicated his/her understanding and acceptance.     Dental Advisory Given  Plan Discussed with: CRNA  Anesthesia Plan Comments:         Anesthesia Quick Evaluation

## 2018-10-25 ENCOUNTER — Encounter: Payer: Self-pay | Admitting: Urology

## 2018-10-27 LAB — SURGICAL PATHOLOGY

## 2019-10-04 DIAGNOSIS — N1832 Chronic kidney disease, stage 3b: Secondary | ICD-10-CM | POA: Insufficient documentation

## 2020-03-17 ENCOUNTER — Emergency Department: Payer: Medicare HMO

## 2020-03-17 ENCOUNTER — Other Ambulatory Visit: Payer: Self-pay

## 2020-03-17 ENCOUNTER — Inpatient Hospital Stay
Admission: EM | Admit: 2020-03-17 | Discharge: 2020-03-20 | DRG: 307 | Disposition: A | Discharge status: Other Acute Inpatient Hospital | Payer: Medicare HMO | Attending: Internal Medicine | Admitting: Internal Medicine

## 2020-03-17 DIAGNOSIS — Z8249 Family history of ischemic heart disease and other diseases of the circulatory system: Secondary | ICD-10-CM | POA: Diagnosis not present

## 2020-03-17 DIAGNOSIS — R0602 Shortness of breath: Secondary | ICD-10-CM

## 2020-03-17 DIAGNOSIS — I130 Hypertensive heart and chronic kidney disease with heart failure and stage 1 through stage 4 chronic kidney disease, or unspecified chronic kidney disease: Secondary | ICD-10-CM | POA: Diagnosis present

## 2020-03-17 DIAGNOSIS — N1832 Chronic kidney disease, stage 3b: Secondary | ICD-10-CM | POA: Diagnosis present

## 2020-03-17 DIAGNOSIS — Z87891 Personal history of nicotine dependence: Secondary | ICD-10-CM | POA: Diagnosis not present

## 2020-03-17 DIAGNOSIS — N179 Acute kidney failure, unspecified: Secondary | ICD-10-CM | POA: Diagnosis present

## 2020-03-17 DIAGNOSIS — Z8546 Personal history of malignant neoplasm of prostate: Secondary | ICD-10-CM | POA: Diagnosis not present

## 2020-03-17 DIAGNOSIS — I482 Chronic atrial fibrillation, unspecified: Secondary | ICD-10-CM | POA: Diagnosis not present

## 2020-03-17 DIAGNOSIS — E785 Hyperlipidemia, unspecified: Secondary | ICD-10-CM | POA: Diagnosis present

## 2020-03-17 DIAGNOSIS — I4819 Other persistent atrial fibrillation: Secondary | ICD-10-CM | POA: Diagnosis present

## 2020-03-17 DIAGNOSIS — Z7901 Long term (current) use of anticoagulants: Secondary | ICD-10-CM | POA: Diagnosis not present

## 2020-03-17 DIAGNOSIS — Z9079 Acquired absence of other genital organ(s): Secondary | ICD-10-CM | POA: Diagnosis not present

## 2020-03-17 DIAGNOSIS — Z20822 Contact with and (suspected) exposure to covid-19: Secondary | ICD-10-CM | POA: Diagnosis present

## 2020-03-17 DIAGNOSIS — R531 Weakness: Secondary | ICD-10-CM

## 2020-03-17 DIAGNOSIS — I509 Heart failure, unspecified: Secondary | ICD-10-CM

## 2020-03-17 DIAGNOSIS — Z791 Long term (current) use of non-steroidal anti-inflammatories (NSAID): Secondary | ICD-10-CM | POA: Diagnosis not present

## 2020-03-17 DIAGNOSIS — Z87442 Personal history of urinary calculi: Secondary | ICD-10-CM | POA: Diagnosis not present

## 2020-03-17 DIAGNOSIS — R7989 Other specified abnormal findings of blood chemistry: Secondary | ICD-10-CM | POA: Diagnosis present

## 2020-03-17 DIAGNOSIS — I350 Nonrheumatic aortic (valve) stenosis: Secondary | ICD-10-CM | POA: Diagnosis present

## 2020-03-17 DIAGNOSIS — I5032 Chronic diastolic (congestive) heart failure: Secondary | ICD-10-CM | POA: Diagnosis present

## 2020-03-17 DIAGNOSIS — Z79899 Other long term (current) drug therapy: Secondary | ICD-10-CM

## 2020-03-17 DIAGNOSIS — I447 Left bundle-branch block, unspecified: Secondary | ICD-10-CM

## 2020-03-17 DIAGNOSIS — I10 Essential (primary) hypertension: Secondary | ICD-10-CM | POA: Diagnosis present

## 2020-03-17 LAB — BASIC METABOLIC PANEL
Anion gap: 11 (ref 5–15)
BUN: 47 mg/dL — ABNORMAL HIGH (ref 8–23)
CO2: 23 mmol/L (ref 22–32)
Calcium: 9.5 mg/dL (ref 8.9–10.3)
Chloride: 103 mmol/L (ref 98–111)
Creatinine, Ser: 1.69 mg/dL — ABNORMAL HIGH (ref 0.61–1.24)
GFR, Estimated: 40 mL/min — ABNORMAL LOW (ref >60)
Glucose, Bld: 118 mg/dL — ABNORMAL HIGH (ref 70–99)
Potassium: 4.4 mmol/L (ref 3.5–5.1)
Sodium: 137 mmol/L (ref 135–145)

## 2020-03-17 LAB — HEPATIC FUNCTION PANEL
ALT: 31 U/L (ref 0–44)
AST: 46 U/L — ABNORMAL HIGH (ref 15–41)
Albumin: 4.1 g/dL (ref 3.5–5.0)
Alkaline Phosphatase: 104 U/L (ref 38–126)
Bilirubin, Direct: 0.6 mg/dL — ABNORMAL HIGH (ref 0.0–0.2)
Indirect Bilirubin: 1.5 mg/dL — ABNORMAL HIGH (ref 0.3–0.9)
Total Bilirubin: 2.1 mg/dL — ABNORMAL HIGH (ref 0.3–1.2)
Total Protein: 7.4 g/dL (ref 6.5–8.1)

## 2020-03-17 LAB — URINALYSIS, COMPLETE (UACMP) WITH MICROSCOPIC
Bacteria, UA: NONE SEEN
Bilirubin Urine: NEGATIVE
Glucose, UA: NEGATIVE mg/dL
Ketones, ur: NEGATIVE mg/dL
Leukocytes,Ua: NEGATIVE
Nitrite: NEGATIVE
Protein, ur: >=300 mg/dL — AB
Specific Gravity, Urine: 1.019 (ref 1.005–1.030)
pH: 5 (ref 5.0–8.0)

## 2020-03-17 LAB — CBC
HCT: 45.8 % (ref 39.0–52.0)
Hemoglobin: 14 g/dL (ref 13.0–17.0)
MCH: 21.1 pg — ABNORMAL LOW (ref 26.0–34.0)
MCHC: 30.6 g/dL (ref 30.0–36.0)
MCV: 68.9 fL — ABNORMAL LOW (ref 80.0–100.0)
Platelets: 187 10*3/uL (ref 150–400)
RBC: 6.65 MIL/uL — ABNORMAL HIGH (ref 4.22–5.81)
RDW: 19.1 % — ABNORMAL HIGH (ref 11.5–15.5)
WBC: 6.6 10*3/uL (ref 4.0–10.5)
nRBC: 0.6 % — ABNORMAL HIGH (ref 0.0–0.2)

## 2020-03-17 LAB — RESP PANEL BY RT-PCR (FLU A&B, COVID) ARPGX2
Influenza A by PCR: NEGATIVE
Influenza B by PCR: NEGATIVE
SARS Coronavirus 2 by RT PCR: NEGATIVE

## 2020-03-17 LAB — D-DIMER, QUANTITATIVE: D-Dimer, Quant: 2.1 ug/mL-FEU — ABNORMAL HIGH (ref 0.00–0.50)

## 2020-03-17 LAB — TROPONIN I (HIGH SENSITIVITY)
Troponin I (High Sensitivity): 145 ng/L (ref <18)
Troponin I (High Sensitivity): 159 ng/L (ref <18)

## 2020-03-17 LAB — BRAIN NATRIURETIC PEPTIDE: B Natriuretic Peptide: 2997.3 pg/mL — ABNORMAL HIGH (ref 0.0–100.0)

## 2020-03-17 MED ORDER — ACETAMINOPHEN 650 MG RE SUPP: 650.0000 mg | Freq: Four times a day (QID) | RECTAL | Status: DC | PRN

## 2020-03-17 MED ORDER — IPRATROPIUM-ALBUTEROL 0.5-2.5 (3) MG/3ML IN SOLN
3.0000 mL | Freq: Once | RESPIRATORY_TRACT | Status: AC
  Administered 2020-03-17: 3 mL via RESPIRATORY_TRACT
  Filled 2020-03-17: qty 3

## 2020-03-17 MED ORDER — ONDANSETRON HCL 4 MG PO TABS: 4.0000 mg | ORAL_TABLET | Freq: Four times a day (QID) | ORAL | Status: DC | PRN

## 2020-03-17 MED ORDER — ACETAMINOPHEN 325 MG PO TABS: 650.0000 mg | ORAL_TABLET | Freq: Four times a day (QID) | ORAL | Status: DC | PRN

## 2020-03-17 MED ORDER — SIMVASTATIN 20 MG PO TABS
40.0000 mg | ORAL_TABLET | ORAL | Status: DC
  Administered 2020-03-18 – 2020-03-20 (×3): 40 mg via ORAL
  Filled 2020-03-17: qty 2
  Filled 2020-03-17: qty 4
  Filled 2020-03-17: qty 2

## 2020-03-17 MED ORDER — APIXABAN 2.5 MG PO TABS
2.5000 mg | ORAL_TABLET | Freq: Two times a day (BID) | ORAL | Status: DC
  Administered 2020-03-17 – 2020-03-19 (×5): 2.5 mg via ORAL
  Filled 2020-03-17 (×7): qty 1

## 2020-03-17 MED ORDER — ONDANSETRON HCL 4 MG/2ML IJ SOLN: 4.0000 mg | Freq: Four times a day (QID) | INTRAMUSCULAR | Status: DC | PRN

## 2020-03-17 MED ORDER — SODIUM CHLORIDE 0.9 % IV BOLUS
250.0000 mL | Freq: Once | INTRAVENOUS | Status: AC
  Administered 2020-03-17: 250 mL via INTRAVENOUS

## 2020-03-17 NOTE — Assessment & Plan Note (Signed)
Admit to telemetry bed.  My suspicion is that the patient's severe AS has progressed to the point where he has low cardiac output.  We will keep the patient n.p.o. in case cardiology wants to perform a transesophageal echo with 3D imaging of his valve which would be the preferred modality.  Given the patient's age at 62, he may be a candidate for TAVR.  Patient be given 250 mL normal saline bolus this he is likely very preload dependent.  He has been taking Lasix at home which has likely made his preload less and has worsened his dyspnea on exertion.  Continue his Toprol-XL.

## 2020-03-17 NOTE — Assessment & Plan Note (Signed)
Continue Toprol-XL.  Hold Lasix.

## 2020-03-17 NOTE — Subjective & Objective (Addendum)
CC: DOE HPI: 79 year old Asian male with a history of aortic stenosis (severe in in 2019 with a estimated valve area of 0.86, peak gradient 61 mmHg), history of A. fib on chronic anticoagulation, hypertension, history of chronic diastolic heart failure presents to the ER today with a several week history of worsening dyspnea on exertion.  Patient is now at the point where he can walk no more than 10 to 20 feet without becoming extremely short of breath.  Patient into the ER tonight due to worsening dyspnea.  Chest x-ray is negative for pulmonary edema.  He has an initial elevated troponin of 145.  Beta peptide is extremely elevated at 2997.  EKG demonstrates rate controlled atrial fibrillation.  Patient denies going to [facility] recently.  He states that this was 3 to 4 years ago.  ER provider has discussed the case with cardiology who wanted the patient admitted.

## 2020-03-17 NOTE — ED Notes (Signed)
MD made aware of elevated trop

## 2020-03-17 NOTE — Assessment & Plan Note (Signed)
-   Continue Eliquis 

## 2020-03-17 NOTE — Assessment & Plan Note (Signed)
Continue statin. 

## 2020-03-17 NOTE — Assessment & Plan Note (Signed)
Hold Lasix.  Patient is euvolemic and probably slightly volume down given his extreme dyspnea on exertion with even mild activity.

## 2020-03-17 NOTE — Progress Notes (Signed)
ANTICOAGULATION CONSULT NOTE - Initial Consult  Pharmacy Consult for Apixaban  Indication: atrial fibrillation  No Known Allergies  Patient Measurements: Height: 5\' 7"  (170.2 cm) Weight: 72.6 kg (160 lb) IBW/kg (Calculated) : 66.1 Heparin Dosing Weight:   Vital Signs: Temp: 97.7 F (36.5 C) (11/29 1534) BP: 132/89 (11/29 2230) Pulse Rate: 73 (11/29 2230)  Labs: Recent Labs    03/17/20 1535 03/17/20 2031 03/17/20 2216  HGB 14.0  --   --   HCT 45.8  --   --   PLT 187  --   --   CREATININE 1.69*  --   --   TROPONINIHS  --  145* 159*    Estimated Creatinine Clearance: 32.1 mL/min (A) (by C-G formula based on SCr of 1.69 mg/dL (H)).   Medical History: Past Medical History:  Diagnosis Date  . Atrial fibrillation ([place])   . Cancer [facility])    prostate  . Chronic kidney disease    H/O KIDNEY STONES  . Dysrhythmia   . Gout   . History of kidney stones   . Hyperlipidemia   . Hypertension   . Prostate cancer ([place])     Medications:  (Not in a hospital admission)   Assessment: Pt ordered Apixaban 5 mg PO BID from PTA med list.  SrCr = 1.69,  Age = 65   Goal of Therapy:  prevention of thromboembolism   Plan:  Eliquis 5 mg PO BID ordered from PTA med list.  Will adjust dose to Eliquis 2.5 mg PO BID based on SrCr and age.   Kiron Osmun D 03/17/2020,11:28 PM

## 2020-03-17 NOTE — ED Triage Notes (Signed)
PT to ED from home c/o weakness and shob for about 5 days, states that he went to urgent care this morning and was told he had fluid around his heart and lungs. Hx of CHF, has been taking his lasix. No extra edema.

## 2020-03-17 NOTE — H&P (Signed)
History and Physical    Ross Mccann GHW:[id number] DOB: [birth year] DOA: 03/17/2020  PCP: Tracie Harrier, MD   Patient coming from: Home  I have personally briefly reviewed patient's old medical records in [place]  CC: DOE HPI: 79 year old Asian male with a history of aortic stenosis (severe in in 2019 with a estimated valve area of 0.86, peak gradient 61 mmHg), history of A. fib on chronic anticoagulation, hypertension, history of chronic diastolic heart failure presents to the ER today with a several week history of worsening dyspnea on exertion.  Patient is now at the point where he can walk no more than 10 to 20 feet without becoming extremely short of breath.  Patient into the ER tonight due to worsening dyspnea.  Chest x-ray is negative for pulmonary edema.  He has an initial elevated troponin of 145.  Beta peptide is extremely elevated at 2997.  EKG demonstrates rate controlled atrial fibrillation.  Patient denies going to [facility] recently.  He states that this was 3 to 4 years ago.  ER provider has discussed the case with cardiology who wanted the patient admitted.    ED Course: pt seen in ER. Labs performed. 250 ml NS bolus ordered after I discussed this with ER provider. Cardiology consulted by ER provider.  Review of Systems:  Review of Systems  Constitutional: Negative.   HENT: Negative.   Eyes: Negative.   Respiratory: Positive for shortness of breath.   Cardiovascular: Negative for chest pain and leg swelling.       Dyspnea on exertion  Gastrointestinal: Negative.   Genitourinary: Negative.   Musculoskeletal: Negative.   Skin: Negative.   Neurological: Negative.   Endo/Heme/Allergies: Negative.   Psychiatric/Behavioral: Negative.   All other systems reviewed and are negative.   Past Medical History:  Diagnosis Date  . Atrial fibrillation ([place])   . Cancer [facility])    prostate  . Chronic kidney disease    H/O KIDNEY STONES  . Dysrhythmia     . Gout   . History of kidney stones   . Hyperlipidemia   . Hypertension   . Prostate cancer [facility])     Past Surgical History:  Procedure Laterality Date  . COLONOSCOPY    . HEMORROIDECTOMY    . HOLMIUM LASER APPLICATION N/A 7/89/3810   Procedure: HOLMIUM LASER APPLICATION;  Surgeon: Royston Cowper, MD;  Location: [facility] ORS;  Service: Urology;  Laterality: N/A;  . INSERTION PROSTATE RADIATION SEED    . LITHOTRIPSY    . TRANSURETHRAL RESECTION OF BLADDER TUMOR WITH MITOMYCIN-C N/A 10/24/2018   Procedure: TRANSURETHRAL RESECTION OF BLADDER TUMOR WITH MITOMYCIN-C;  Surgeon: Royston Cowper, MD;  Location: [facility] ORS;  Service: Urology;  Laterality: N/A;  . TRANSURETHRAL RESECTION OF PROSTATE N/A 12/02/2015   Procedure: TRANSURETHRAL RESECTION OF THE PROSTATE (TURP);  Surgeon: Royston Cowper, MD;  Location: [facility] ORS;  Service: Urology;  Laterality: N/A;  . VASECTOMY       reports that he quit smoking about 41 years ago. His smoking use included cigarettes. He has a 20.00 pack-year smoking history. He has never used smokeless tobacco. He reports previous alcohol use of about 7.0 standard drinks of alcohol per week. He reports that he does not use drugs.  No Known Allergies  Family History  Problem Relation Age of Onset  . Heart failure Sister        d. age 53    Prior to Admission medications   Medication Sig Start Date  End Date Taking? Authorizing Provider  apixaban (ELIQUIS) 5 MG TABS tablet Take 5 mg by mouth 2 (two) times daily.    [provider]  bicalutamide (CASODEX) 50 MG tablet Take 1 tablet (50 mg total) by mouth daily. 04/01/18   Mayo, Pete Pelt, MD  ciprofloxacin (CIPRO) 500 MG tablet Take 1 tablet (500 mg total) by mouth 2 (two) times daily. 10/24/18   Royston Cowper, MD  folic acid (FOLVITE) 332 MCG tablet Take 800 mcg by mouth daily.    [provider]  furosemide (LASIX) 20 MG tablet Take 1 tablet (20 mg total) by mouth daily for 7 days. 06/10/18  10/17/18  Carrie Mew, MD  Leuprolide Acetate, 6 Month, (LUPRON DEPOT, 57-MONTH,) 45 MG injection Inject 45 mg into the muscle every 6 (six) months.    [provider]  meloxicam (MOBIC) 7.5 MG tablet Take 7.5 mg by mouth daily as needed for pain.     [provider]  Meth-Hyo-M Bl-Na Phos-Ph Sal (URIBEL) 118 MG CAPS Take 1 capsule (118 mg total) by mouth every 6 (six) hours as needed. 10/24/18   Royston Cowper, MD  metoprolol succinate (TOPROL-XL) 25 MG 24 hr tablet Take 0.5 tablets (12.5 mg total) by mouth daily. 03/31/18   Mayo, Pete Pelt, MD  Multiple Vitamins-Minerals (CENTRUM SILVER 50+MEN PO) Take 1 tablet by mouth 3 (three) times a week.    [provider]  potassium chloride (K-DUR) 10 MEQ tablet Take 1 tablet (10 mEq total) by mouth daily. 06/10/18   Carrie Mew, MD  Probiotic Product (PROBIOTIC PO) Take 1 capsule by mouth 3 (three) times a week.    [provider]  simvastatin (ZOCOR) 40 MG tablet Take 40 mg by mouth every morning.     [provider]    Physical Exam: Vitals:   03/17/20 2030 03/17/20 2100 03/17/20 2130 03/17/20 2230  BP: (!) 110/97 (!) 129/100 (!) 155/90 132/89  Pulse: (!) 39 80 (!) 102 73  Resp: 19 12 16  (!) 24  Temp:      SpO2: 99%  100% 100%  Weight:      Height:        Physical Exam   Labs on Admission: I have personally reviewed following labs and imaging studies  CBC: Recent Labs  Lab 03/17/20 1535  WBC 6.6  HGB 14.0  HCT 45.8  MCV 68.9*  PLT 951   Basic Metabolic Panel: Recent Labs  Lab 03/17/20 1535  NA 137  K 4.4  CL 103  CO2 23  GLUCOSE 118*  BUN 47*  CREATININE 1.69*  CALCIUM 9.5   GFR: Estimated Creatinine Clearance: 32.1 mL/min (A) (by C-G formula based on SCr of 1.69 mg/dL (H)). Liver Function Tests: Recent Labs  Lab 03/17/20 2031  AST 46*  ALT 31  ALKPHOS 104  BILITOT 2.1*  PROT 7.4  ALBUMIN 4.1   No results for input(s): LIPASE, AMYLASE in the last 168  hours. No results for input(s): AMMONIA in the last 168 hours. Coagulation Profile: No results for input(s): INR, PROTIME in the last 168 hours. Cardiac Enzymes: No results for input(s): CKTOTAL, CKMB, CKMBINDEX, TROPONINI in the last 168 hours. BNP (last 3 results) No results for input(s): PROBNP in the last 8760 hours. HbA1C: No results for input(s): HGBA1C in the last 72 hours. CBG: No results for input(s): GLUCAP in the last 168 hours. Lipid Profile: No results for input(s): CHOL, HDL, LDLCALC, TRIG, CHOLHDL, LDLDIRECT in the last 72  hours. Thyroid Function Tests: No results for input(s): TSH, T4TOTAL, FREET4, T3FREE, THYROIDAB in the last 72 hours. Anemia Panel: No results for input(s): VITAMINB12, FOLATE, FERRITIN, TIBC, IRON, RETICCTPCT in the last 72 hours. Urine analysis:    Component Value Date/Time   COLORURINE YELLOW (A) 03/17/2020 1535   APPEARANCEUR HAZY (A) 03/17/2020 1535   LABSPEC 1.019 03/17/2020 1535   PHURINE 5.0 03/17/2020 1535   GLUCOSEU NEGATIVE 03/17/2020 1535   HGBUR LARGE (A) 03/17/2020 1535   BILIRUBINUR NEGATIVE 03/17/2020 1535   KETONESUR NEGATIVE 03/17/2020 1535   PROTEINUR >=300 (A) 03/17/2020 1535   NITRITE NEGATIVE 03/17/2020 1535   LEUKOCYTESUR NEGATIVE 03/17/2020 1535    Radiological Exams on Admission: I have personally reviewed images DG Chest 2 View  Result Date: 03/17/2020 CLINICAL DATA:  79 year old male with shortness of breath EXAM: CHEST - 2 VIEW COMPARISON:  Chest radiograph dated 06/10/2018. FINDINGS: Diffuse interstitial coarsening and mild chronic bronchitic changes. No focal consolidation, pleural effusion or pneumothorax. Top-normal cardiac silhouette. Atherosclerotic calcification of the aorta. No acute osseous pathology. IMPRESSION: No active cardiopulmonary disease. Electronically Signed   By: Anner Crete M.D.   On: 03/17/2020 16:05    EKG: I have personally reviewed EKG: afib   79 yo asian male admitted for  worsening DOE and suspected low flow severe aortic stenosis.  Assessment/Plan Principal Problem:   Severe aortic stenosis Active Problems:   Chronic atrial fibrillation ([facility])   Chronic diastolic CHF (congestive heart failure) ([facility])   Hypertension   Hyperlipidemia, unspecified   Chronic anticoagulation - on Eliquis for afib    Severe aortic stenosis Admit to telemetry bed.  My suspicion is that the patient's severe AS has progressed to the point where he has low cardiac output.  We will keep the patient n.p.o. in case cardiology wants to perform a transesophageal echo with 3D imaging of his valve which would be the preferred modality.  Given the patient's age at 58, he may be a candidate for TAVR.  Patient be given 250 mL normal saline bolus this he is likely very preload dependent.  He has been taking Lasix at home which has likely made his preload less and has worsened his dyspnea on exertion.  Continue his Toprol-XL.  Chronic atrial fibrillation ([facility]) Continue Toprol-XL.  Continue Eliquis.  Chronic diastolic CHF (congestive heart failure) ([facility]) Hold Lasix.  Patient is euvolemic and probably slightly volume down given his extreme dyspnea on exertion with even mild activity.  Hypertension Continue Toprol-XL.  Hold Lasix.  Hyperlipidemia, unspecified Continue statin.  Chronic anticoagulation - on Eliquis for afib Continue Eliquis.   DVT prophylaxis: Eliquis Code Status: Full Code Family Communication: no family at bedside  Disposition Plan: will likely need transfer to tertiary/quaternary care center that is able to perform TAVR vs surgical valve replacement  Consults called: ER provider has discussed case with on-call cardiology dr. Nehemiah Massed Admission status: Inpatient, Telemetry bed   Kristopher Oppenheim, DO [facility] 03/17/2020, 10:57 PM

## 2020-03-17 NOTE — ED Provider Notes (Signed)
[facility] Emergency Department Provider Note   ____________________________________________   First MD Initiated Contact with Patient 03/17/20 1957     (approximate)  I have reviewed the triage vital signs and the nursing notes.   HISTORY  Chief Complaint Weakness and Shortness of Breath    HPI Ross Mccann is a 79 y.o. male who reports he has had about 5 days of worsening weakness and shortness of breath.  He said he went to urgent care was told he had fluid around his lungs and heart after having a chest x-ray.  Patient reportedly recently went to [facility] and had some CHF or swelling there was treated with Lasix and got better.  Part of the problem is thought to be due to altitude exposure.      Past Medical History:  Diagnosis Date  . Atrial fibrillation ([place])   . Cancer [facility])    prostate  . Chronic kidney disease    H/O KIDNEY STONES  . Dysrhythmia   . Gout   . History of kidney stones   . Hyperlipidemia   . Hypertension   . Prostate cancer [facility])     Patient Active Problem List   Diagnosis Date Noted  . Chest pain 03/30/2018    Past Surgical History:  Procedure Laterality Date  . COLONOSCOPY    . HEMORROIDECTOMY    . HOLMIUM LASER APPLICATION N/A 07/16/5186   Procedure: HOLMIUM LASER APPLICATION;  Surgeon: Royston Cowper, MD;  Location: [facility] ORS;  Service: Urology;  Laterality: N/A;  . INSERTION PROSTATE RADIATION SEED    . LITHOTRIPSY    . TRANSURETHRAL RESECTION OF BLADDER TUMOR WITH MITOMYCIN-C N/A 10/24/2018   Procedure: TRANSURETHRAL RESECTION OF BLADDER TUMOR WITH MITOMYCIN-C;  Surgeon: Royston Cowper, MD;  Location: [facility] ORS;  Service: Urology;  Laterality: N/A;  . TRANSURETHRAL RESECTION OF PROSTATE N/A 12/02/2015   Procedure: TRANSURETHRAL RESECTION OF THE PROSTATE (TURP);  Surgeon: Royston Cowper, MD;  Location: [facility] ORS;  Service: Urology;  Laterality: N/A;  . VASECTOMY      Prior to Admission medications     Medication Sig Start Date End Date Taking? Authorizing Provider  apixaban (ELIQUIS) 5 MG TABS tablet Take 5 mg by mouth 2 (two) times daily.    [provider]  bicalutamide (CASODEX) 50 MG tablet Take 1 tablet (50 mg total) by mouth daily. 04/01/18   Mayo, Pete Pelt, MD  ciprofloxacin (CIPRO) 500 MG tablet Take 1 tablet (500 mg total) by mouth 2 (two) times daily. 10/24/18   Royston Cowper, MD  folic acid (FOLVITE) 416 MCG tablet Take 800 mcg by mouth daily.    [provider]  furosemide (LASIX) 20 MG tablet Take 1 tablet (20 mg total) by mouth daily for 7 days. 06/10/18 10/17/18  Carrie Mew, MD  Leuprolide Acetate, 6 Month, (LUPRON DEPOT, 63-MONTH,) 45 MG injection Inject 45 mg into the muscle every 6 (six) months.    [provider]  meloxicam (MOBIC) 7.5 MG tablet Take 7.5 mg by mouth daily as needed for pain.     [provider]  Meth-Hyo-M Bl-Na Phos-Ph Sal (URIBEL) 118 MG CAPS Take 1 capsule (118 mg total) by mouth every 6 (six) hours as needed. 10/24/18   Royston Cowper, MD  metoprolol succinate (TOPROL-XL) 25 MG 24 hr tablet Take 0.5 tablets (12.5 mg total) by mouth daily. 03/31/18   Mayo, Pete Pelt, MD  Multiple Vitamins-Minerals (CENTRUM SILVER 50+MEN PO) Take 1 tablet  by mouth 3 (three) times a week.    [provider]  potassium chloride (K-DUR) 10 MEQ tablet Take 1 tablet (10 mEq total) by mouth daily. 06/10/18   Carrie Mew, MD  Probiotic Product (PROBIOTIC PO) Take 1 capsule by mouth 3 (three) times a week.    [provider]  simvastatin (ZOCOR) 40 MG tablet Take 40 mg by mouth every morning.     [provider]    Allergies Patient has no known allergies.  Family History  Problem Relation Age of Onset  . Heart failure Sister        d. age 72    Social History Social History   Tobacco Use  . Smoking status: Former Smoker    Packs/day: 1.00    Years: 20.00    Pack years: 20.00    Types:  Cigarettes    Quit date: 09/18/1978    Years since quitting: 41.5  . Smokeless tobacco: Never Used  Vaping Use  . Vaping Use: Never used  Substance Use Topics  . Alcohol use: Not Currently    Alcohol/week: 7.0 standard drinks    Types: 7 Cans of beer per week    Comment: 1 BEER DAY  . Drug use: No    Review of Systems  Constitutional: No fever/chills Eyes: No visual changes. ENT: No sore throat. Cardiovascular: Denies chest pain. Respiratory:shortness of breath. Gastrointestinal: No abdominal pain.  No nausea, no vomiting.  No diarrhea.  No constipation. Genitourinary: Negative for dysuria. Musculoskeletal: Negative for back pain. Skin: Negative for rash. Neurological: Negative for headaches, focal weakness   ____________________________________________   PHYSICAL EXAM:  VITAL SIGNS: ED Triage Vitals  Enc Vitals Group     BP 03/17/20 1534 129/89     Pulse Rate 03/17/20 1531 98     Resp 03/17/20 1534 20     Temp 03/17/20 1534 97.7 F (36.5 C)     Temp src --      SpO2 03/17/20 1530 100 %     Weight 03/17/20 1531 160 lb (72.6 kg)     Height 03/17/20 1531 5\' 7"  (1.702 m)     Head Circumference --      Peak Flow --      Pain Score 03/17/20 1530 0     Pain Loc --    Constitutional: Alert and oriented. Well appearing and in no acute distress. Eyes: Conjunctivae are normal. PER Head: Atraumatic. Nose: No congestion/rhinnorhea. Mouth/Throat: Mucous membranes are moist.  Oropharynx non-erythematous. Neck: No stridor.  Cardiovascular: Normal rate, regular rhythm. Grossly normal heart sounds.  Good peripheral circulation. Respiratory: Normal respiratory effort.  No retractions. Lungs CTAB. Gastrointestinal: Soft and nontender. No distention. No abdominal bruits. No CVA tenderness. Musculoskeletal: No lower extremity tenderness slight trace edema.   Neurologic:  Normal speech and language. No gross focal neurologic deficits are appreciated.. Skin:  Skin is warm, dry and  intact. No rash noted.  ____________________________________________   LABS (all labs ordered are listed, but only abnormal results are displayed)  Labs Reviewed  BASIC METABOLIC PANEL - Abnormal; Notable for the following components:      Result Value   Glucose, Bld 118 (*)    BUN 47 (*)    Creatinine, Ser 1.69 (*)    GFR, Estimated 40 (*)    All other components within normal limits  CBC - Abnormal; Notable for the following components:   RBC 6.65 (*)    MCV 68.9 (*)    MCH 21.1 (*)  RDW 19.1 (*)    nRBC 0.6 (*)    All other components within normal limits  URINALYSIS, COMPLETE (UACMP) WITH MICROSCOPIC - Abnormal; Notable for the following components:   Color, Urine YELLOW (*)    APPearance HAZY (*)    Hgb urine dipstick LARGE (*)    Protein, ur >=300 (*)    All other components within normal limits  HEPATIC FUNCTION PANEL - Abnormal; Notable for the following components:   AST 46 (*)    Total Bilirubin 2.1 (*)    Bilirubin, Direct 0.6 (*)    Indirect Bilirubin 1.5 (*)    All other components within normal limits  BRAIN NATRIURETIC PEPTIDE - Abnormal; Notable for the following components:   B Natriuretic Peptide 2,997.3 (*)    All other components within normal limits  TROPONIN I (HIGH SENSITIVITY) - Abnormal; Notable for the following components:   Troponin I (High Sensitivity) 145 (*)    All other components within normal limits  RESP PANEL BY RT-PCR (FLU A&B, COVID) ARPGX2  D-DIMER, QUANTITATIVE (NOT AT [facility])  TROPONIN I (HIGH SENSITIVITY)   ____________________________________________  EKG  EKG read interpreted by me ____________________________________________  RADIOLOGY Gertha Calkin, personally viewed and evaluated these images (plain radiographs) as part of my medical decision making, as well as reviewing the written report by the radiologist.  ED MD interpretation: Chest x-ray read by radiology reviewed by me does not show any obvious  CHF.  Official radiology report(s): DG Chest 2 View  Result Date: 03/17/2020 CLINICAL DATA:  79 year old male with shortness of breath EXAM: CHEST - 2 VIEW COMPARISON:  Chest radiograph dated 06/10/2018. FINDINGS: Diffuse interstitial coarsening and mild chronic bronchitic changes. No focal consolidation, pleural effusion or pneumothorax. Top-normal cardiac silhouette. Atherosclerotic calcification of the aorta. No acute osseous pathology. IMPRESSION: No active cardiopulmonary disease. Electronically Signed   By: Anner Crete M.D.   On: 03/17/2020 16:05    ____________________________________________   PROCEDURES  Procedure(s) performed (including Critical Care):  Procedures   ____________________________________________   INITIAL IMPRESSION / ASSESSMENT AND PLAN / ED COURSE  Patient with recent bout of CHF in [place] felt partially due to altitude.  He comes in with about a week of increasing shortness of breath and weakness.  He was told he had fluid around his heart and lungs based on chest x-ray at urgent care.  Here his chest x-ray does not look like CHF.  He does have a markedly elevated BNP and troponin.  I discussed his case in detail with Dr. Nehemiah Massed the cardiologist who follows this gentleman for his aortic stenosis.  Dr. Nehemiah Massed feels that we should put this patient in the hospital to further evaluate the causes of his new left bundle branch block and his  worsening symptoms elevated BNP and troponin             ____________________________________________   FINAL CLINICAL IMPRESSION(S) / ED DIAGNOSES  Final diagnoses:  Weakness  Shortness of breath  Elevated brain natriuretic peptide (BNP) level  New onset left bundle branch block (LBBB)     ED Discharge Orders    None      *Please note:  Danyon B Rivenbark was evaluated in Emergency Department on 03/17/2020 for the symptoms described in the history of present illness. He was evaluated in the  context of the global COVID-19 pandemic, which necessitated consideration that the patient might be at risk for infection with the SARS-CoV-2 virus that causes COVID-19. Institutional protocols  and algorithms that pertain to the evaluation of patients at risk for COVID-19 are in a state of rapid change based on information released by regulatory bodies including the CDC and federal and state organizations. These policies and algorithms were followed during the patient's care in the ED.  Some ED evaluations and interventions may be delayed as a result of limited staffing during and the pandemic.*   Note:  This document was prepared using Dragon voice recognition software and may include unintentional dictation errors.    Nena Polio, MD 03/17/20 2227

## 2020-03-17 NOTE — Assessment & Plan Note (Signed)
Continue Toprol-XL.  Continue Eliquis.

## 2020-03-18 ENCOUNTER — Encounter: Payer: Self-pay | Admitting: Internal Medicine

## 2020-03-18 ENCOUNTER — Inpatient Hospital Stay: Payer: Medicare HMO

## 2020-03-18 DIAGNOSIS — I350 Nonrheumatic aortic (valve) stenosis: Secondary | ICD-10-CM | POA: Diagnosis not present

## 2020-03-18 LAB — TROPONIN I (HIGH SENSITIVITY): Troponin I (High Sensitivity): 164 ng/L (ref <18)

## 2020-03-18 LAB — CBC WITH DIFFERENTIAL/PLATELET
Abs Immature Granulocytes: 0.03 10*3/uL (ref 0.00–0.07)
Basophils Absolute: 0.1 10*3/uL (ref 0.0–0.1)
Basophils Relative: 1 %
Eosinophils Absolute: 0.1 10*3/uL (ref 0.0–0.5)
Eosinophils Relative: 2 %
HCT: 41 % (ref 39.0–52.0)
Hemoglobin: 13.1 g/dL (ref 13.0–17.0)
Immature Granulocytes: 0 %
Lymphocytes Relative: 23 %
Lymphs Abs: 1.6 10*3/uL (ref 0.7–4.0)
MCH: 21.8 pg — ABNORMAL LOW (ref 26.0–34.0)
MCHC: 32 g/dL (ref 30.0–36.0)
MCV: 68.2 fL — ABNORMAL LOW (ref 80.0–100.0)
Monocytes Absolute: 0.6 10*3/uL (ref 0.1–1.0)
Monocytes Relative: 9 %
Neutro Abs: 4.4 10*3/uL (ref 1.7–7.7)
Neutrophils Relative %: 65 %
Platelets: 168 10*3/uL (ref 150–400)
RBC: 6.01 MIL/uL — ABNORMAL HIGH (ref 4.22–5.81)
RDW: 18.6 % — ABNORMAL HIGH (ref 11.5–15.5)
WBC: 6.8 10*3/uL (ref 4.0–10.5)
nRBC: 0.6 % — ABNORMAL HIGH (ref 0.0–0.2)

## 2020-03-18 LAB — COMPREHENSIVE METABOLIC PANEL
ALT: 31 U/L (ref 0–44)
AST: 41 U/L (ref 15–41)
Albumin: 3.7 g/dL (ref 3.5–5.0)
Alkaline Phosphatase: 93 U/L (ref 38–126)
Anion gap: 11 (ref 5–15)
BUN: 49 mg/dL — ABNORMAL HIGH (ref 8–23)
CO2: 24 mmol/L (ref 22–32)
Calcium: 9.6 mg/dL (ref 8.9–10.3)
Chloride: 105 mmol/L (ref 98–111)
Creatinine, Ser: 1.78 mg/dL — ABNORMAL HIGH (ref 0.61–1.24)
GFR, Estimated: 38 mL/min — ABNORMAL LOW (ref >60)
Glucose, Bld: 113 mg/dL — ABNORMAL HIGH (ref 70–99)
Potassium: 4.7 mmol/L (ref 3.5–5.1)
Sodium: 140 mmol/L (ref 135–145)
Total Bilirubin: 2 mg/dL — ABNORMAL HIGH (ref 0.3–1.2)
Total Protein: 7.1 g/dL (ref 6.5–8.1)

## 2020-03-18 MED ORDER — METOPROLOL SUCCINATE ER 25 MG PO TB24
12.5000 mg | ORAL_TABLET | Freq: Every day | ORAL | Status: DC
  Administered 2020-03-18 – 2020-03-20 (×3): 12.5 mg via ORAL
  Filled 2020-03-18: qty 0.5
  Filled 2020-03-18 (×2): qty 1
  Filled 2020-03-18: qty 0.5

## 2020-03-18 MED ORDER — TECHNETIUM TO 99M ALBUMIN AGGREGATED
4.3400 | Freq: Once | INTRAVENOUS | Status: AC | PRN
  Administered 2020-03-18: 4.34 via INTRAVENOUS
  Filled 2020-03-18: qty 4.34

## 2020-03-18 NOTE — ED Notes (Signed)
Pt wife at bedside.

## 2020-03-18 NOTE — ED Notes (Signed)
Patient taken to Nuclear medicine scan by tech. Wife remains in the room.

## 2020-03-18 NOTE — ED Notes (Addendum)
Chelsea from [facility] stated they will take pt to NM at 2pm. Will inform pt.

## 2020-03-18 NOTE — Progress Notes (Signed)
PROGRESS NOTE    Ross Mccann  MVH:[id number] DOB: 1940/10/01 DOA: 03/17/2020 PCP: Tracie Harrier, MD   Brief Narrative:  HPI: 79 year old Asian male with a history of aortic stenosis (severe in in 2019 with a estimated valve area of 0.86, peak gradient 61 mmHg), history of A. fib on chronic anticoagulation, hypertension, history of chronic diastolic heart failure presents to the ER today with a several week history of worsening dyspnea on exertion.  Patient is now at the point where he can walk no more than 10 to 20 feet without becoming extremely short of breath.  Patient into the ER tonight due to worsening dyspnea.  Chest x-ray is negative for pulmonary edema.  He has an initial elevated troponin of 145.  Beta peptide is extremely elevated at 2997.  EKG demonstrates rate controlled atrial fibrillation.  Patient denies going to [facility] recently.  He states that this was 3 to 4 years ago.  ER provider has discussed the case with cardiology who wanted the patient admitted.    ED Course: pt seen in ER. Labs performed. 250 ml NS bolus ordered after I discussed this with ER provider. Cardiology consulted by ER provider.  Assessment & Plan:   Principal Problem:   Severe aortic stenosis Active Problems:   Chronic atrial fibrillation (HCC)   Chronic diastolic CHF (congestive heart failure) (HCC)   Hypertension   Hyperlipidemia, unspecified   Chronic anticoagulation - on Eliquis for afib   Dyspnea on exertion likely secondary to  aortic stenosis: Echo in 2019 shows moderate aortic stenosis.  Interestingly, patient tells me that he has never been told anything about that and he has a regular cardiologist who he saw about 2 months ago.  Patient is still significantly symptomatic.  Sitting at the edge of the bed.  He gets shortness of breath with walking couple of steps.  No chest pain or any dizziness.  He also has elevated D-dimer.  Creatinine elevated.  Unable to do CT  angiogram of the chest so I will proceed with VQ scan to rule out PE although I doubt it will be positive.  He will be seen by cardiology.  Further management deferred to them.  He remains n.p.o. until seen by cardiology.  Chronic atrial fibrillation ([place]): Controlled. Continue Toprol-XL.  Continue Eliquis.  Chronic diastolic CHF (congestive heart failure) ([place]) Patient appears euvolemic with trace bilateral lower extremity edema, no crackles.  Although he has elevated BNP but clinically he does not seem to be having acute exacerbation.  Hold Lasix for now.  Will await further recommendations from cardiology.  Hypertension Continue Toprol-XL.  Hold Lasix.  Hyperlipidemia, unspecified Continue statin.  Chronic anticoagulation - on Eliquis for afib Continue Eliquis.  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  Monitor.  Avoid nephrotoxic agents for now.  DVT prophylaxis: apixaban (ELIQUIS) tablet 2.5 mg Start: 03/17/20 2315 SCDs Start: 03/17/20 2312   Code Status: Full Code  Family Communication:  None present at bedside.  Plan of care discussed with patient in length and he verbalized understanding and agreed with it.  Status is: Inpatient  Remains inpatient appropriate because:Ongoing diagnostic testing needed not appropriate for outpatient work up   Dispo: The patient is from: Home              Anticipated d/c is to: Home              Anticipated d/c date is: 1 day  Patient currently is not medically stable to d/c.        Estimated body mass index is 25.06 kg/m as calculated from the following:   Height as of this encounter: 5\' 7"  (1.702 m).   Weight as of this encounter: 72.6 kg.      Nutritional status:               Consultants:   Cardiology  Procedures:   None  Antimicrobials:  Anti-infectives (From admission, onward)   None         Subjective: Seen and examined.  Sitting at the edge of the bed.  Tells me that he gets  shortness of breath with walking couple of steps even in the room.  No chest pain or any other complaint.  Objective: Vitals:   03/18/20 0430 03/18/20 0445 03/18/20 0530 03/18/20 0600  BP: (!) 111/99  (!) 118/93 (!) 111/99  Pulse:  84 64 (!) 30  Resp: (!) 22 13 11 16   Temp:      SpO2: 98% 99% 97% 94%  Weight:      Height:        Intake/Output Summary (Last 24 hours) at 03/18/2020 0700 Last data filed at 03/17/2020 2258 Gross per 24 hour  Intake 250 ml  Output --  Net 250 ml   Filed Weights   03/17/20 1531  Weight: 72.6 kg    Examination:  General exam: Appears calm and comfortable  Respiratory system: Clear to auscultation. Respiratory effort normal. Cardiovascular system: S1 & S2 heard, RRR. No JVD, rubs, gallops or clicks.  3/6 systolic murmur.  Trace bilateral lower extremity pedal edema. Gastrointestinal system: Abdomen is nondistended, soft and nontender. No organomegaly or masses felt. Normal bowel sounds heard. Central nervous system: Alert and oriented. No focal neurological deficits. Extremities: Symmetric 5 x 5 power. Skin: No rashes, lesions or ulcers Psychiatry: Judgement and insight appear normal. Mood & affect appropriate.    Data Reviewed: I have personally reviewed following labs and imaging studies  CBC: Recent Labs  Lab 03/17/20 1535 03/18/20 0452  WBC 6.6 6.8  NEUTROABS  --  4.4  HGB 14.0 13.1  HCT 45.8 41.0  MCV 68.9* 68.2*  PLT 187 884   Basic Metabolic Panel: Recent Labs  Lab 03/17/20 1535 03/18/20 0452  NA 137 140  K 4.4 4.7  CL 103 105  CO2 23 24  GLUCOSE 118* 113*  BUN 47* 49*  CREATININE 1.69* 1.78*  CALCIUM 9.5 9.6   GFR: Estimated Creatinine Clearance: 30.4 mL/min (A) (by C-G formula based on SCr of 1.78 mg/dL (H)). Liver Function Tests: Recent Labs  Lab 03/17/20 2031 03/18/20 0452  AST 46* 41  ALT 31 31  ALKPHOS 104 93  BILITOT 2.1* 2.0*  PROT 7.4 7.1  ALBUMIN 4.1 3.7   No results for input(s): LIPASE,  AMYLASE in the last 168 hours. No results for input(s): AMMONIA in the last 168 hours. Coagulation Profile: No results for input(s): INR, PROTIME in the last 168 hours. Cardiac Enzymes: No results for input(s): CKTOTAL, CKMB, CKMBINDEX, TROPONINI in the last 168 hours. BNP (last 3 results) No results for input(s): PROBNP in the last 8760 hours. HbA1C: No results for input(s): HGBA1C in the last 72 hours. CBG: No results for input(s): GLUCAP in the last 168 hours. Lipid Profile: No results for input(s): CHOL, HDL, LDLCALC, TRIG, CHOLHDL, LDLDIRECT in the last 72 hours. Thyroid Function Tests: No results for input(s): TSH, T4TOTAL, FREET4, T3FREE, THYROIDAB in the last  72 hours. Anemia Panel: No results for input(s): VITAMINB12, FOLATE, FERRITIN, TIBC, IRON, RETICCTPCT in the last 72 hours. Sepsis Labs: No results for input(s): PROCALCITON, LATICACIDVEN in the last 168 hours.  Recent Results (from the past 240 hour(s))  Resp Panel by RT-PCR (Flu A&B, Covid) Nasopharyngeal Swab     Status: None   Collection Time: 03/17/20  8:31 PM   Specimen: Nasopharyngeal Swab; Nasopharyngeal(NP) swabs in vial transport medium  Result Value Ref Range Status   SARS Coronavirus 2 by RT PCR NEGATIVE NEGATIVE Final    Comment: (NOTE) SARS-CoV-2 target nucleic acids are NOT DETECTED.  The SARS-CoV-2 RNA is generally detectable in upper respiratory specimens during the acute phase of infection. The lowest concentration of SARS-CoV-2 viral copies this assay can detect is 138 copies/mL. A negative result does not preclude SARS-Cov-2 infection and should not be used as the sole basis for treatment or other patient management decisions. A negative result may occur with  improper specimen collection/handling, submission of specimen other than nasopharyngeal swab, presence of viral mutation(s) within the areas targeted by this assay, and inadequate number of viral copies(<138 copies/mL). A negative result  must be combined with clinical observations, patient history, and epidemiological information. The expected result is Negative.  Fact Sheet for Patients:  EntrepreneurPulse.com.au  Fact Sheet for Healthcare Providers:  IncredibleEmployment.be  This test is no t yet approved or cleared by the [place] FDA and  has been authorized for detection and/or diagnosis of SARS-CoV-2 by FDA under an Emergency Use Authorization (EUA). This EUA will remain  in effect (meaning this test can be used) for the duration of the COVID-19 declaration under Section 564(b)(1) of the Act, 21 U.S.C.section 360bbb-3(b)(1), unless the authorization is terminated  or revoked sooner.       Influenza A by PCR NEGATIVE NEGATIVE Final   Influenza B by PCR NEGATIVE NEGATIVE Final    Comment: (NOTE) The Xpert Xpress SARS-CoV-2/FLU/RSV plus assay is intended as an aid in the diagnosis of influenza from Nasopharyngeal swab specimens and should not be used as a sole basis for treatment. Nasal washings and aspirates are unacceptable for Xpert Xpress SARS-CoV-2/FLU/RSV testing.  Fact Sheet for Patients: EntrepreneurPulse.com.au  Fact Sheet for Healthcare Providers: IncredibleEmployment.be  This test is not yet approved or cleared by the [place] FDA and has been authorized for detection and/or diagnosis of SARS-CoV-2 by FDA under an Emergency Use Authorization (EUA). This EUA will remain in effect (meaning this test can be used) for the duration of the COVID-19 declaration under Section 564(b)(1) of the Act, 21 U.S.C. section 360bbb-3(b)(1), unless the authorization is terminated or revoked.  Performed at [facility], [address] [place], [place] [address]       Radiology Studies: DG Chest 2 View  Result Date: 03/17/2020 CLINICAL DATA:  79 year old male with shortness of breath EXAM: CHEST - 2 VIEW  COMPARISON:  Chest radiograph dated 06/10/2018. FINDINGS: Diffuse interstitial coarsening and mild chronic bronchitic changes. No focal consolidation, pleural effusion or pneumothorax. Top-normal cardiac silhouette. Atherosclerotic calcification of the aorta. No acute osseous pathology. IMPRESSION: No active cardiopulmonary disease. Electronically Signed   By: Anner Crete M.D.   On: 03/17/2020 16:05    Scheduled Meds: . apixaban  2.5 mg Oral BID  . simvastatin  40 mg Oral [id number]   Continuous Infusions:   LOS: 1 day   Time spent: 34 minutes   Darliss Cheney, MD [facility]  03/18/2020, 7:00 AM   To contact the attending  provider between 7A-7P or the covering provider during after hours 7P-7A, please log into the web site [email or web address].

## 2020-03-18 NOTE — ED Notes (Signed)
Pt talked to wife who told him he needs to stay at the hospital and receive treatment.  Pt agreeable to stay at this time.

## 2020-03-18 NOTE — ED Notes (Signed)
Informed Dr. Doristine Bosworth about Ross Mccann wanting to leave AMA. Ross Mccann waiting for ride at this time.

## 2020-03-18 NOTE — ED Notes (Signed)
Pt voicing that he wants to go home and talk to extended family who are in healthcare field about what's going on with him. Offered pt a phone to call extended family to talk about what's going on. Pt has already talked to admitting MD about plan of action for today and what's going on with pt. Dr. Doristine Bosworth messaged and informed that pt wants to leave and go home and talk to family for a few days. Pt informed about his elevated troponin levels and consult to cardiology. Also informed of NM scan at 2pm. Pt still wants to leave. Pt states he would come back if he got worse or if his healthcare family members tell him to come back. Pt again informed that he is able to make this decision but that this RN and MD advise against leaving. Informed that pt will not be discharged but would have to sign AMA if he wants to leave. Pt calling wife for a ride at this time.

## 2020-03-18 NOTE — ED Notes (Signed)
Patient is back from Nuclear Med.

## 2020-03-19 ENCOUNTER — Inpatient Hospital Stay
Admit: 2020-03-19 | Discharge: 2020-03-19 | Disposition: A | Discharge status: Home / Self Care | Payer: Medicare HMO | Attending: Internal Medicine | Admitting: Internal Medicine

## 2020-03-19 DIAGNOSIS — I350 Nonrheumatic aortic (valve) stenosis: Secondary | ICD-10-CM | POA: Diagnosis not present

## 2020-03-19 LAB — MAGNESIUM: Magnesium: 2.7 mg/dL — ABNORMAL HIGH (ref 1.7–2.4)

## 2020-03-19 LAB — CBC
HCT: 44.5 % (ref 39.0–52.0)
Hemoglobin: 14 g/dL (ref 13.0–17.0)
MCH: 21.4 pg — ABNORMAL LOW (ref 26.0–34.0)
MCHC: 31.5 g/dL (ref 30.0–36.0)
MCV: 68.1 fL — ABNORMAL LOW (ref 80.0–100.0)
Platelets: 189 10*3/uL (ref 150–400)
RBC: 6.53 MIL/uL — ABNORMAL HIGH (ref 4.22–5.81)
RDW: 19 % — ABNORMAL HIGH (ref 11.5–15.5)
WBC: 7.7 10*3/uL (ref 4.0–10.5)
nRBC: 1 % — ABNORMAL HIGH (ref 0.0–0.2)

## 2020-03-19 LAB — BASIC METABOLIC PANEL
Anion gap: 11 (ref 5–15)
BUN: 56 mg/dL — ABNORMAL HIGH (ref 8–23)
CO2: 24 mmol/L (ref 22–32)
Calcium: 9.6 mg/dL (ref 8.9–10.3)
Chloride: 105 mmol/L (ref 98–111)
Creatinine, Ser: 1.95 mg/dL — ABNORMAL HIGH (ref 0.61–1.24)
GFR, Estimated: 34 mL/min — ABNORMAL LOW (ref >60)
Glucose, Bld: 121 mg/dL — ABNORMAL HIGH (ref 70–99)
Potassium: 4.4 mmol/L (ref 3.5–5.1)
Sodium: 140 mmol/L (ref 135–145)

## 2020-03-19 MED ORDER — PERFLUTREN LIPID MICROSPHERE
1.0000 mL | INTRAVENOUS | Status: DC | PRN
  Administered 2020-03-19: 2 mL via INTRAVENOUS
  Filled 2020-03-19: qty 10

## 2020-03-19 MED ORDER — LACTATED RINGERS IV SOLN: INTRAVENOUS | Status: DC

## 2020-03-19 MED ORDER — BICALUTAMIDE 50 MG PO TABS
50.0000 mg | ORAL_TABLET | Freq: Every day | ORAL | Status: DC
  Administered 2020-03-19: 50 mg via ORAL
  Filled 2020-03-19: qty 1

## 2020-03-19 NOTE — Consult Note (Signed)
[facility] Consultation Note  Patient ID: Ross Mccann, MRN: [id number], DOB/AGE: 1940/09/01 79 y.o. Admit date: 03/17/2020   Date of Consult: 03/19/2020 Primary Physician: Tracie Harrier, MD Primary Cardiologist: Paraschos  Chief Complaint:  Chief Complaint  Patient presents with  . Weakness  . Shortness of Breath   Reason for Consult: Shortness of breath aortic stenosis  HPI: 79 y.o. male with known chronic nonvalvular atrial fibrillation hypertension hyperlipidemia chronic kidney disease stage III with moderate to severe aortic valve stenosis by echocardiogram last year.  The patient apparently also has chronic diastolic dysfunction congestive heart failure.  He had severe symptoms of weakness fatigue and unable to do any physical activity and this ensued over the last several days to a week.  This would wax and wane with and without physical activity.  The patient then was seen in the emergency room with a BNP of 2987 troponin at 159/164 glomerular filtration rate of 38 an EKG showing atrial fibrillation with controlled ventricular rate and left bundle branch block and a chest x-ray showing no evidence of issues.  The patient has previously been on appropriate medication management for hypertension control and left ventricular hypertrophy with metoprolol.  The symptoms he is having may be consistent with possible progression of aortic valve stenosis versus side effects of medications.  He claims that he has been on medication management for prostate cancer for which he is stopped for the last few days.  He feels much better at this point and has not had any other concerns.  The patient additionally has been able to ambulate in the room with improvements.  This is without any additional medication management or diuresis despite elevated BNP.  His atrial fibrillation has had reasonable heart rate control.  Repeat echocardiogram has been ordered and will need further  evaluation clinically.  There is no apparent continued anginal symptoms and/or acute coronary syndrome at this time  Past Medical History:  Diagnosis Date  . Atrial fibrillation ([place])   . Cancer [facility])    prostate  . Chronic kidney disease    H/O KIDNEY STONES  . Dysrhythmia   . Gout   . History of kidney stones   . Hyperlipidemia   . Hypertension   . Prostate cancer [facility])       Surgical History:  Past Surgical History:  Procedure Laterality Date  . COLONOSCOPY    . HEMORROIDECTOMY    . HOLMIUM LASER APPLICATION N/A 2/63/3354   Procedure: HOLMIUM LASER APPLICATION;  Surgeon: Royston Cowper, MD;  Location: [facility] ORS;  Service: Urology;  Laterality: N/A;  . INSERTION PROSTATE RADIATION SEED    . LITHOTRIPSY    . TRANSURETHRAL RESECTION OF BLADDER TUMOR WITH MITOMYCIN-C N/A 10/24/2018   Procedure: TRANSURETHRAL RESECTION OF BLADDER TUMOR WITH MITOMYCIN-C;  Surgeon: Royston Cowper, MD;  Location: [facility] ORS;  Service: Urology;  Laterality: N/A;  . TRANSURETHRAL RESECTION OF PROSTATE N/A 12/02/2015   Procedure: TRANSURETHRAL RESECTION OF THE PROSTATE (TURP);  Surgeon: Royston Cowper, MD;  Location: [facility] ORS;  Service: Urology;  Laterality: N/A;  . VASECTOMY       Home Meds: Prior to Admission medications   Medication Sig Start Date End Date Taking? Authorizing Provider  apixaban (ELIQUIS) 5 MG TABS tablet Take 2.5 mg by mouth 2 (two) times daily.    Yes [provider]  bicalutamide (CASODEX) 50 MG tablet Take 1 tablet (50 mg total) by mouth daily. 04/01/18  Yes Mayo, Pete Pelt, MD  folic acid (FOLVITE) 932 MCG tablet Take 800 mcg by mouth daily.   Yes [provider]  furosemide (LASIX) 20 MG tablet Take 20 mg by mouth daily.  10/04/19  Yes [provider]  meloxicam (MOBIC) 7.5 MG tablet Take 7.5 mg by mouth daily as needed for pain.    Yes [provider]  metoprolol succinate (TOPROL-XL) 25 MG 24 hr tablet Take 0.5 tablets (12.5 mg total) by  mouth daily. 03/31/18  Yes Mayo, Pete Pelt, MD  Multiple Vitamins-Minerals (CENTRUM SILVER 50+MEN PO) Take 1 tablet by mouth 3 (three) times a week.   Yes [provider]  potassium chloride (KLOR-CON) 10 MEQ tablet Take 10 mEq by mouth daily.   Yes [provider]  Probiotic Product (PROBIOTIC PO) Take 1 capsule by mouth 3 (three) times a week.   Yes [provider]  simvastatin (ZOCOR) 40 MG tablet Take 40 mg by mouth every morning.    Yes [provider]    Inpatient Medications:  . apixaban  2.5 mg Oral BID  . bicalutamide  50 mg Oral Daily  . metoprolol succinate  12.5 mg Oral Daily  . simvastatin  40 mg Oral BH-q7a   . lactated ringers 50 mL/hr at 03/19/20 1243    Allergies: No Known Allergies  Social History   Socioeconomic History  . Marital status: Married    Spouse name: Not on file  . Number of children: Not on file  . Years of education: Not on file  . Highest education level: Not on file  Occupational History  . Not on file  Tobacco Use  . Smoking status: Former Smoker    Packs/day: 1.00    Years: 20.00    Pack years: 20.00    Types: Cigarettes    Quit date: 09/18/1978    Years since quitting: 41.5  . Smokeless tobacco: Never Used  Vaping Use  . Vaping Use: Never used  Substance and Sexual Activity  . Alcohol use: Not Currently    Alcohol/week: 7.0 standard drinks    Types: 7 Cans of beer per week    Comment: 1 BEER DAY  . Drug use: No  . Sexual activity: Not on file  Other Topics Concern  . Not on file  Social History Narrative  . Not on file   Social Determinants of Health   Financial Resource Strain:   . Difficulty of Paying Living Expenses: Not on file  Food Insecurity:   . Worried About Charity fundraiser in the Last Year: Not on file  . Ran Out of Food in the Last Year: Not on file  Transportation Needs:   . Lack of Transportation (Medical): Not on file  . Lack of Transportation (Non-Medical): Not on  file  Physical Activity:   . Days of Exercise per Week: Not on file  . Minutes of Exercise per Session: Not on file  Stress:   . Feeling of Stress : Not on file  Social Connections:   . Frequency of Communication with Friends and Family: Not on file  . Frequency of Social Gatherings with Friends and Family: Not on file  . Attends Religious Services: Not on file  . Active Member of Clubs or Organizations: Not on file  . Attends Archivist Meetings: Not on file  . Marital Status: Not on file  Intimate Partner Violence:   . Fear of Current or Ex-Partner: Not on file  . Emotionally Abused: Not on file  .  Physically Abused: Not on file  . Sexually Abused: Not on file     Family History  Problem Relation Age of Onset  . Heart failure Sister        d. age 57     Review of Systems Positive for shortness of breath weakness Negative for: General:  chills, fever, night sweats or weight changes.  Cardiovascular: PND orthopnea syncope dizziness  Dermatological skin lesions rashes Respiratory: Cough congestion Urologic: Frequent urination urination at night and hematuria Abdominal: negative for nausea, vomiting, diarrhea, bright red blood per rectum, melena, or hematemesis Neurologic: negative for visual changes, and/or hearing changes  All other systems reviewed and are otherwise negative except as noted above.  Labs: No results for input(s): CKTOTAL, CKMB, TROPONINI in the last 72 hours. Lab Results  Component Value Date   WBC 7.7 03/19/2020   HGB 14.0 03/19/2020   HCT 44.5 03/19/2020   MCV 68.1 (L) 03/19/2020   PLT 189 03/19/2020    Recent Labs  Lab 03/18/20 0452 03/18/20 0452 03/19/20 0419  NA 140   < > 140  K 4.7   < > 4.4  CL 105   < > 105  CO2 24   < > 24  BUN 49*   < > 56*  CREATININE 1.78*   < > 1.95*  CALCIUM 9.6   < > 9.6  PROT 7.1  --   --   BILITOT 2.0*  --   --   ALKPHOS 93  --   --   ALT 31  --   --   AST 41  --   --   GLUCOSE 113*   < >  121*   < > = values in this interval not displayed.   No results found for: CHOL, HDL, LDLCALC, TRIG Lab Results  Component Value Date   DDIMER 2.10 (H) 03/17/2020    Radiology/Studies:  DG Chest 2 View  Result Date: 03/17/2020 CLINICAL DATA:  79 year old male with shortness of breath EXAM: CHEST - 2 VIEW COMPARISON:  Chest radiograph dated 06/10/2018. FINDINGS: Diffuse interstitial coarsening and mild chronic bronchitic changes. No focal consolidation, pleural effusion or pneumothorax. Top-normal cardiac silhouette. Atherosclerotic calcification of the aorta. No acute osseous pathology. IMPRESSION: No active cardiopulmonary disease. Electronically Signed   By: Anner Crete M.D.   On: 03/17/2020 16:05   NM Pulmonary Perfusion  Result Date: 03/18/2020 CLINICAL DATA:  Worsening shortness of breath over last 4-5 days EXAM: NUCLEAR MEDICINE PERFUSION LUNG SCAN TECHNIQUE: Perfusion images were obtained in multiple projections after intravenous injection of radiopharmaceutical. Ventilation scans intentionally deferred if perfusion scan and chest x-ray adequate for interpretation during COVID 19 epidemic. RADIOPHARMACEUTICALS:  4.34 mCi Tc-51m MAA IV COMPARISON:  03/17/2020 FINDINGS: Normal symmetrical distribution of radiotracer bilaterally. There are no perfusion defects suspicious for pulmonary embolus. IMPRESSION: 1. No evidence of pulmonary embolus on perfusion imaging. Electronically Signed   By: Randa Ngo M.D.   On: 03/18/2020 16:36    EKG: Atrial fibrillation with controlled ventricular rate and left bundle branch block  Weights: Filed Weights   03/17/20 1531 03/19/20 0515  Weight: 72.6 kg 73.3 kg     Physical Exam: Blood pressure (!) 114/101, pulse (!) 52, temperature 97.9 F (36.6 C), temperature source Oral, resp. rate 17, height 5\' 7"  (1.702 m), weight 73.3 kg, SpO2 100 %. Body mass index is 25.31 kg/m. General: Well developed, well nourished, in no acute  distress. Head eyes ears nose throat: Normocephalic, atraumatic, sclera non-icteric, no  xanthomas, nares are without discharge. No apparent thyromegaly and/or mass  Lungs: Normal respiratory effort.  no wheezes, no rales, no rhonchi.  Heart: RRR with normal S1 SOFT S2.  4+ aortic murmur gallop, no rub, PMI is normal size and placement, carotid upstroke normal without bruit, jugular venous pressure is normal Abdomen: Soft, non-tender, non-distended with normoactive bowel sounds. No hepatomegaly. No rebound/guarding. No obvious abdominal masses. Abdominal aorta is normal size without bruit Extremities: Trace edema. no cyanosis, no clubbing, no ulcers  Peripheral : 2+ bilateral upper extremity pulses, 2+ bilateral femoral pulses, 2+ bilateral dorsal pedal pulse Neuro: Alert and oriented. No facial asymmetry. No focal deficit. Moves all extremities spontaneously. Musculoskeletal: Normal muscle tone without kyphosis Psych:  Responds to questions appropriately with a normal affect.    Assessment: 79 year old male with prostate cancer treated with multiple medications moderate to severe aortic valve stenosis chronic diastolic dysfunction congestive heart failure hypertension hyperlipidemia with chronic kidney disease stage III and chronic nonvalvular atrial fibrillation with controlled ventricular rate with weakness and fatigue of unknown etiology but no evidence of current pulmonary edema by chest x-ray or acute coronary syndrome  Plan: 1.  Continue metoprolol for chronic diastolic dysfunction congestive heart failure without change in dose at this time due to concerns of bradycardia and watch for telemetry for rapid heart rate 2.  Repeat echocardiogram to assess for extent of valvular heart disease contributing to current symptoms and ambulate in the room to evaluate improvements of symptoms 3.  Continue anticoagulation for further risk reduction in stroke with atrial fibrillation 4.  Avoid any  medication management including prostate cancer medications for which the patient feels as if was causing the majority of his symptoms.  We will reevaluate this issue tomorrow 5.  No interventional cardiac procedures at this time due to need in evaluation of extent of prostate cancer concerns prior to the possibility of TAVR procedure.  Will discuss with Dr. Saralyn Pilar as well  Signed, Corey Skains M.D. [facility] 03/19/2020, 6:37 PM

## 2020-03-19 NOTE — Progress Notes (Signed)
PROGRESS NOTE    Ross Mccann  NWG:[id number] DOB: 06-19-1940 DOA: 03/17/2020 PCP: Tracie Harrier, MD   Brief Narrative: Taken from prior notes. 79 year old Asian male with a history of aortic stenosis (severe in in 2019 with a estimated valve area of 0.86,peak gradient 61 mmHg),history of A. fib on chronic anticoagulation, hypertension, history of chronic diastolic heart failure presents to the ER today with a several week history of worsening dyspnea on exertion. Patient is now at the point where he can walk no more than 10 to 20 feet without becoming extremely short of breath.  Chest x-ray within normal limit.  Elevated troponin and BNP.  EKG with controlled atrial fibrillation. Cardiology was consulted by ER provider and he was admitted at the request.  Subjective: Patient continued to have exertional dyspnea.  Wife at bedside.  Denies any chest pain.  Able to lay flat, no orthopnea or PND.  Assessment & Plan:   Principal Problem:   Severe aortic stenosis Active Problems:   Chronic atrial fibrillation (H[facility])   Chronic diastolic CHF (congestive heart failure) (H[facility])   Hypertension   Hyperlipidemia, unspecified   Chronic anticoagulation - on Eliquis for afib  Worsening dyspnea on exertion.  Most likely secondary to aortic stenosis.  VQ scan negative for PE, which was done with elevated D-dimer. Appears euvolemic.  Prior echocardiogram done in 2019 with moderate aortic stenosis. Cardiology was consulted by ED provider.  No note yet. Sent a message to Dr. Charlesetta Ivory will see the patient. -Repeat echocardiogram. -Might need TEE for further evaluation of aortic valve.  AKI with CKD stage IIIb.  Worsening creatinine, patient appears euvolemic. Creatinine continued to get worse despite holding Lasix. -Give him some gentle IV fluid. -Continue to monitor. -Avoid nephrotoxins.  Chronic HFpEF.  Elevated BNP but clinically appears euvolemic.  Patient also has CKD and aortic  stenosis.  Home dose of Lasix was held for concern of AKI. Cardiology recommendations pending. -Repeat echocardiogram. -Closely monitor volume status.  Chronic persistent atrial fibrillation.  Heart rate well controlled. -Continue home dose of Toprol and Eliquis.  Hypertension.  Blood pressure within goal. -Continue home dose of Toprol. -Holding Lasix.  Hyperlipidemia. -Continue home dose of statin.  Objective: Vitals:   03/18/20 1933 03/19/20 0515 03/19/20 0719 03/19/20 1109  BP: 122/89 116/78 109/71 113/90  Pulse: 74 94 67 69  Resp: 18 (!) 21 20 15   Temp: 97.6 F (36.4 C) 97.7 F (36.5 C) 97.9 F (36.6 C) 97.7 F (36.5 C)  TempSrc: Oral Oral Oral Oral  SpO2:  97% 94% 100%  Weight:  73.3 kg    Height:        Intake/Output Summary (Last 24 hours) at 03/19/2020 1333 Last data filed at 03/19/2020 5784 Gross per 24 hour  Intake 360 ml  Output 0 ml  Net 360 ml   Filed Weights   03/17/20 1531 03/19/20 0515  Weight: 72.6 kg 73.3 kg    Examination:  General exam: Appears calm and comfortable  Respiratory system: Clear to auscultation. Respiratory effort normal. Cardiovascular system: S1 & S2 heard, irregular with regular rate.  Murmur positive. Gastrointestinal system: Soft, nontender, nondistended, bowel sounds positive. Central nervous system: Alert and oriented. No focal neurological deficits.Symmetric 5 x 5 power. Extremities: No edema, no cyanosis, pulses intact and symmetrical. Psychiatry: Judgement and insight appear normal. Mood & affect appropriate.    DVT prophylaxis: Eliquis Code Status: Full Family Communication: Wife was updated at bedside. Disposition Plan:  Status is: Inpatient  Remains inpatient  appropriate because:Inpatient level of care appropriate due to severity of illness   Dispo: The patient is from: Home              Anticipated d/c is to: Home              Anticipated d/c date is: 1 day              Patient currently is not medically  stable to d/c.    Consultants:   Cardiology  Procedures:  Antimicrobials:   Data Reviewed: I have personally reviewed following labs and imaging studies  CBC: Recent Labs  Lab 03/17/20 1535 03/18/20 0452 03/19/20 0419  WBC 6.6 6.8 7.7  NEUTROABS  --  4.4  --   HGB 14.0 13.1 14.0  HCT 45.8 41.0 44.5  MCV 68.9* 68.2* 68.1*  PLT 187 168 322   Basic Metabolic Panel: Recent Labs  Lab 03/17/20 1535 03/18/20 0452 03/19/20 0419  NA 137 140 140  K 4.4 4.7 4.4  CL 103 105 105  CO2 23 24 24   GLUCOSE 118* 113* 121*  BUN 47* 49* 56*  CREATININE 1.69* 1.78* 1.95*  CALCIUM 9.5 9.6 9.6  MG  --   --  2.7*   GFR: Estimated Creatinine Clearance: 27.8 mL/min (A) (by C-G formula based on SCr of 1.95 mg/dL (H)). Liver Function Tests: Recent Labs  Lab 03/17/20 2031 03/18/20 0452  AST 46* 41  ALT 31 31  ALKPHOS 104 93  BILITOT 2.1* 2.0*  PROT 7.4 7.1  ALBUMIN 4.1 3.7   No results for input(s): LIPASE, AMYLASE in the last 168 hours. No results for input(s): AMMONIA in the last 168 hours. Coagulation Profile: No results for input(s): INR, PROTIME in the last 168 hours. Cardiac Enzymes: No results for input(s): CKTOTAL, CKMB, CKMBINDEX, TROPONINI in the last 168 hours. BNP (last 3 results) No results for input(s): PROBNP in the last 8760 hours. HbA1C: No results for input(s): HGBA1C in the last 72 hours. CBG: No results for input(s): GLUCAP in the last 168 hours. Lipid Profile: No results for input(s): CHOL, HDL, LDLCALC, TRIG, CHOLHDL, LDLDIRECT in the last 72 hours. Thyroid Function Tests: No results for input(s): TSH, T4TOTAL, FREET4, T3FREE, THYROIDAB in the last 72 hours. Anemia Panel: No results for input(s): VITAMINB12, FOLATE, FERRITIN, TIBC, IRON, RETICCTPCT in the last 72 hours. Sepsis Labs: No results for input(s): PROCALCITON, LATICACIDVEN in the last 168 hours.  Recent Results (from the past 240 hour(s))  Resp Panel by RT-PCR (Flu A&B, Covid)  Nasopharyngeal Swab     Status: None   Collection Time: 03/17/20  8:31 PM   Specimen: Nasopharyngeal Swab; Nasopharyngeal(NP) swabs in vial transport medium  Result Value Ref Range Status   SARS Coronavirus 2 by RT PCR NEGATIVE NEGATIVE Final    Comment: (NOTE) SARS-CoV-2 target nucleic acids are NOT DETECTED.  The SARS-CoV-2 RNA is generally detectable in upper respiratory specimens during the acute phase of infection. The lowest concentration of SARS-CoV-2 viral copies this assay can detect is 138 copies/mL. A negative result does not preclude SARS-Cov-2 infection and should not be used as the sole basis for treatment or other patient management decisions. A negative result may occur with  improper specimen collection/handling, submission of specimen other than nasopharyngeal swab, presence of viral mutation(s) within the areas targeted by this assay, and inadequate number of viral copies(<138 copies/mL). A negative result must be combined with clinical observations, patient history, and epidemiological information. The expected result is Negative.  Fact Sheet for Patients:  EntrepreneurPulse.com.au  Fact Sheet for Healthcare Providers:  IncredibleEmployment.be  This test is no t yet approved or cleared by the [place] FDA and  has been authorized for detection and/or diagnosis of SARS-CoV-2 by FDA under an Emergency Use Authorization (EUA). This EUA will remain  in effect (meaning this test can be used) for the duration of the COVID-19 declaration under Section 564(b)(1) of the Act, 21 U.S.C.section 360bbb-3(b)(1), unless the authorization is terminated  or revoked sooner.       Influenza A by PCR NEGATIVE NEGATIVE Final   Influenza B by PCR NEGATIVE NEGATIVE Final    Comment: (NOTE) The Xpert Xpress SARS-CoV-2/FLU/RSV plus assay is intended as an aid in the diagnosis of influenza from Nasopharyngeal swab specimens and should not be  used as a sole basis for treatment. Nasal washings and aspirates are unacceptable for Xpert Xpress SARS-CoV-2/FLU/RSV testing.  Fact Sheet for Patients: EntrepreneurPulse.com.au  Fact Sheet for Healthcare Providers: IncredibleEmployment.be  This test is not yet approved or cleared by the [place] FDA and has been authorized for detection and/or diagnosis of SARS-CoV-2 by FDA under an Emergency Use Authorization (EUA). This EUA will remain in effect (meaning this test can be used) for the duration of the COVID-19 declaration under Section 564(b)(1) of the Act, 21 U.S.C. section 360bbb-3(b)(1), unless the authorization is terminated or revoked.  Performed at [facility], [address] [place], [place] [address]      Radiology Studies: DG Chest 2 View  Result Date: 03/17/2020 CLINICAL DATA:  79 year old male with shortness of breath EXAM: CHEST - 2 VIEW COMPARISON:  Chest radiograph dated 06/10/2018. FINDINGS: Diffuse interstitial coarsening and mild chronic bronchitic changes. No focal consolidation, pleural effusion or pneumothorax. Top-normal cardiac silhouette. Atherosclerotic calcification of the aorta. No acute osseous pathology. IMPRESSION: No active cardiopulmonary disease. Electronically Signed   By: Anner Crete M.D.   On: 03/17/2020 16:05   NM Pulmonary Perfusion  Result Date: 03/18/2020 CLINICAL DATA:  Worsening shortness of breath over last 4-5 days EXAM: NUCLEAR MEDICINE PERFUSION LUNG SCAN TECHNIQUE: Perfusion images were obtained in multiple projections after intravenous injection of radiopharmaceutical. Ventilation scans intentionally deferred if perfusion scan and chest x-ray adequate for interpretation during COVID 19 epidemic. RADIOPHARMACEUTICALS:  4.34 mCi Tc-2m MAA IV COMPARISON:  03/17/2020 FINDINGS: Normal symmetrical distribution of radiotracer bilaterally. There are no perfusion defects suspicious for  pulmonary embolus. IMPRESSION: 1. No evidence of pulmonary embolus on perfusion imaging. Electronically Signed   By: Randa Ngo M.D.   On: 03/18/2020 16:36    Scheduled Meds: . apixaban  2.5 mg Oral BID  . bicalutamide  50 mg Oral Daily  . metoprolol succinate  12.5 mg Oral Daily  . simvastatin  40 mg Oral BH-q7a   Continuous Infusions: . lactated ringers 50 mL/hr at 03/19/20 1243     LOS: 2 days   Time spent: 35 minutes.  Lorella Nimrod, MD [facility]  If 7PM-7AM, please contact night-coverage [email or web address]  03/19/2020, 1:33 PM   This record has been created using Systems analyst. Errors have been sought and corrected,but may not always be located. Such creation errors do not reflect on the standard of care.

## 2020-03-20 ENCOUNTER — Inpatient Hospital Stay: Payer: Medicare HMO

## 2020-03-20 DIAGNOSIS — I350 Nonrheumatic aortic (valve) stenosis: Secondary | ICD-10-CM | POA: Diagnosis not present

## 2020-03-20 LAB — BASIC METABOLIC PANEL
Anion gap: 11 (ref 5–15)
BUN: 61 mg/dL — ABNORMAL HIGH (ref 8–23)
CO2: 22 mmol/L (ref 22–32)
Calcium: 9.1 mg/dL (ref 8.9–10.3)
Chloride: 106 mmol/L (ref 98–111)
Creatinine, Ser: 1.84 mg/dL — ABNORMAL HIGH (ref 0.61–1.24)
GFR, Estimated: 36 mL/min — ABNORMAL LOW (ref >60)
Glucose, Bld: 107 mg/dL — ABNORMAL HIGH (ref 70–99)
Potassium: 4.5 mmol/L (ref 3.5–5.1)
Sodium: 139 mmol/L (ref 135–145)

## 2020-03-20 LAB — ECHOCARDIOGRAM COMPLETE
AR max vel: 0.32 cm2
AV Area VTI: 0.38 cm2
AV Area mean vel: 0.34 cm2
AV Mean grad: 23.4 mmHg
AV Peak grad: 36.8 mmHg
Ao pk vel: 3.03 m/s
Height: 67 in
P 1/2 time: 634 msec
S' Lateral: 4.77 cm
Weight: 2585.55 oz

## 2020-03-20 LAB — RESP PANEL BY RT-PCR (FLU A&B, COVID) ARPGX2
Influenza A by PCR: NEGATIVE
Influenza B by PCR: NEGATIVE
SARS Coronavirus 2 by RT PCR: NEGATIVE

## 2020-03-20 NOTE — Progress Notes (Addendum)
[facility] Encounter Note  Patient: Ross Mccann / Admit Date: 03/17/2020 / Date of Encounter: 03/20/2020, 7:05 AM   Subjective: 12/2 patient still having significant severe shortness of breath at rest and unable to get out of the bed without this severe shortness of breath.  Heart rate has been controlled with metoprolol with atrial fibrillation and an average heart rate of 90 bpm with left bundle branch block.  The left bundle branch block is a new finding from last year..  Patient's chest x-ray on admission was normal although patient does have need for the possibility of reevaluation due to concerns of heart failure.  No evidence of lower extremity edema.  Troponin level most consistent with demand ischemia rather than acute coronary syndrome. Repeat chest x-ray today shows no evidence of pulmonary edema or vascular congestion  Echocardiogram 12/20 showing normal LV systolic function with ejection fraction of 50%.  Patient's aortic velocity was 3.8 m/s with a peak velocity of 58 mm and a mean of 30 mm  Echocardiogram 12/21 severe global LV systolic dysfunction with ejection fraction of 25% with moderate dilation of the left ventricle.  Aortic valve velocity 3.5 m/s with a peak velocity of 49 mm and a mean velocity of 30 mm  Review of Systems: Positive for: Shortness of breath Negative for: Vision change, hearing change, syncope, dizziness, nausea, vomiting,diarrhea, bloody stool, stomach pain, cough, congestion, diaphoresis, urinary frequency, urinary pain,skin lesions, skin rashes Others previously listed  Objective: Telemetry: Atrial fibrillation with controlled ventricular rate and left bundle branch block Physical Exam: Blood pressure (!) 113/92, pulse 65, temperature (!) 97.4 F (36.3 C), temperature source Oral, resp. rate 16, height 5\' 7"  (1.702 m), weight 74.4 kg, SpO2 100 %. Body mass index is 25.69 kg/m. General: Well developed, well nourished, in no  acute distress. Head: Normocephalic, atraumatic, sclera non-icteric, no xanthomas, nares are without discharge. Neck: No apparent masses Lungs: Normal respirations with few wheezes, no rhonchi, no rales , few basilar crackles   Heart: Irregular rate and rhythm, normal S1 ABSENT S2, 4+ aortic murmur, no rub, no gallop, PMI is normal size and placement, carotid upstroke normal with  bruit, jugular venous pressure normal Abdomen: Soft, non-tender, non-distended with normoactive bowel sounds. No hepatosplenomegaly. Abdominal aorta is normal size without bruit Extremities: No edema, no clubbing, no cyanosis, no ulcers,  Peripheral: 2+ radial, 2+ femoral, 2+ dorsal pedal pulses Neuro: Alert and oriented. Moves all extremities spontaneously. Psych:  Responds to questions appropriately with a normal affect.   Intake/Output Summary (Last 24 hours) at 03/20/2020 0705 Last data filed at 03/20/2020 0556 Gross per 24 hour  Intake --  Output 0 ml  Net 0 ml    Inpatient Medications:  . metoprolol succinate  12.5 mg Oral Daily  . simvastatin  40 mg Oral [id number]   Infusions:  . lactated ringers 50 mL/hr at 03/20/20 1941    Labs: Recent Labs    03/19/20 0419 03/20/20 0334  NA 140 139  K 4.4 4.5  CL 105 106  CO2 24 22  GLUCOSE 121* 107*  BUN 56* 61*  CREATININE 1.95* 1.84*  CALCIUM 9.6 9.1  MG 2.7*  --    Recent Labs    03/17/20 2031 03/18/20 0452  AST 46* 41  ALT 31 31  ALKPHOS 104 93  BILITOT 2.1* 2.0*  PROT 7.4 7.1  ALBUMIN 4.1 3.7   Recent Labs    03/18/20 0452 03/19/20 0419  WBC 6.8 7.7  NEUTROABS 4.4  --  HGB 13.1 14.0  HCT 41.0 44.5  MCV 68.2* 68.1*  PLT 168 189   No results for input(s): CKTOTAL, CKMB, TROPONINI in the last 72 hours. Invalid input(s): POCBNP No results for input(s): HGBA1C in the last 72 hours.   Weights: Filed Weights   03/17/20 1531 03/19/20 0515 03/20/20 0556  Weight: 72.6 kg 73.3 kg 74.4 kg     Radiology/Studies:  DG Chest 2  View  Result Date: 03/17/2020 CLINICAL DATA:  79 year old male with shortness of breath EXAM: CHEST - 2 VIEW COMPARISON:  Chest radiograph dated 06/10/2018. FINDINGS: Diffuse interstitial coarsening and mild chronic bronchitic changes. No focal consolidation, pleural effusion or pneumothorax. Top-normal cardiac silhouette. Atherosclerotic calcification of the aorta. No acute osseous pathology. IMPRESSION: No active cardiopulmonary disease. Electronically Signed   By: Anner Crete M.D.   On: 03/17/2020 16:05   NM Pulmonary Perfusion  Result Date: 03/18/2020 CLINICAL DATA:  Worsening shortness of breath over last 4-5 days EXAM: NUCLEAR MEDICINE PERFUSION LUNG SCAN TECHNIQUE: Perfusion images were obtained in multiple projections after intravenous injection of radiopharmaceutical. Ventilation scans intentionally deferred if perfusion scan and chest x-ray adequate for interpretation during COVID 19 epidemic. RADIOPHARMACEUTICALS:  4.34 mCi Tc-75m MAA IV COMPARISON:  03/17/2020 FINDINGS: Normal symmetrical distribution of radiotracer bilaterally. There are no perfusion defects suspicious for pulmonary embolus. IMPRESSION: 1. No evidence of pulmonary embolus on perfusion imaging. Electronically Signed   By: Randa Ngo M.D.   On: 03/18/2020 16:36   ECHOCARDIOGRAM COMPLETE  Result Date: 03/20/2020    ECHOCARDIOGRAM REPORT   Patient Name:   Ross Mccann Date of Exam: 03/19/2020 Medical Rec #:  [id number]          Height:       67.0 in Accession #:    [id number]         Weight:       161.6 lb Date of Birth:  [birth year]          BSA:          1.847 m Patient Age:    79 years           BP:           114/101 mmHg Patient Gender: M                  HR:           102 bpm. Exam Location:  [facility] Procedure: 2D Echo, Cardiac Doppler, Color Doppler and Intracardiac            Opacification Agent Indications:     R01.2 Abnormal Heart Sounds  History:         Patient has prior history of Echocardiogram  examinations, most                  recent 03/30/2018. Risk Factors:Hypertension and Dyslipidemia.                  Atrial Fibrillation. Chronic kidney disease.  Sonographer:     Wilford Sports Rodgers-Jones Referring Phys:  [id number] AMIN Diagnosing Phys: Serafina Royals MD IMPRESSIONS  1. Left ventricular ejection fraction, by estimation, is 25 to 30%. The left ventricle has severely decreased function. The left ventricle demonstrates global hypokinesis. The left ventricular internal cavity size was moderately dilated. There is moderate left ventricular hypertrophy. Left ventricular diastolic function could not be evaluated.  2. Right ventricular systolic function is normal. The right ventricular size is normal.  3. Left atrial size was moderately  dilated.  4. Right atrial size was moderately dilated.  5. The mitral valve is normal in structure. Moderate to severe mitral valve regurgitation.  6. 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  7. The aortic valve is tricuspid. Aortic valve regurgitation is mild. Severe aortic valve stenosis. FINDINGS  Left Ventricle: Left ventricular ejection fraction, by estimation, is 25 to 30%. The left ventricle has severely decreased function. The left ventricle demonstrates global hypokinesis. Definity contrast agent was given IV to delineate the left ventricular endocardial borders. The left ventricular internal cavity size was moderately dilated. There is moderate left ventricular hypertrophy. Left ventricular diastolic function could not be evaluated. Right Ventricle: The right ventricular size is normal. No increase in right ventricular wall thickness. Right ventricular systolic function is normal. Left Atrium: Left atrial size was moderately dilated. Right Atrium: Right atrial size was moderately dilated. Pericardium: There is no evidence of pericardial effusion. Mitral Valve: The mitral valve is normal in structure. Moderate to severe mitral valve regurgitation.  Tricuspid Valve: The tricuspid valve is normal in structure. 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 Aortic Valve: The aortic valve is tricuspid. Aortic valve regurgitation is mild. Aortic regurgitation PHT measures 634 msec. Severe aortic stenosis is present. Aortic valve mean gradient measures 23.4 mmHg. Aortic valve peak gradient measures 36.8 mmHg. Aortic valve area, by VTI measures 0.38 cm. Pulmonic Valve: The pulmonic valve was normal in structure. Pulmonic valve regurgitation is trivial. Aorta: The aortic root and ascending aorta are structurally normal, with no evidence of dilitation. IAS/Shunts: No atrial level shunt detected by color flow Doppler.  LEFT VENTRICLE PLAX 2D LVIDd:         5.17 cm LVIDs:         4.77 cm LV PW:         1.09 cm LV IVS:        1.12 cm LVOT diam:     1.80 cm LV SV:         21 LV SV Index:   11 LVOT Area:     2.54 cm  RIGHT VENTRICLE            IVC RV Basal diam:  4.70 cm    IVC diam: 2.50 cm RV S prime:     7.54 cm/s TAPSE (M-mode): 0.7 cm LEFT ATRIUM             Index       RIGHT ATRIUM           Index LA diam:        5.50 cm 2.98 cm/m  RA Area:     29.10 cm LA Vol (A2C):   70.7 ml 38.28 ml/m RA Volume:   113.00 ml 61.18 ml/m LA Vol (A4C):   51.8 ml 28.04 ml/m LA Biplane Vol: 61.5 ml 33.30 ml/m  AORTIC VALVE AV Area (Vmax):    0.32 cm AV Area (Vmean):   0.34 cm AV Area (VTI):     0.38 cm AV Vmax:           303.20 cm/s AV Vmean:          226.200 cm/s AV VTI:            0.555 m AV Peak Grad:      36.8 mmHg AV Mean Grad:      23.4 mmHg LVOT Vmax:         38.20 cm/s LVOT Vmean:        30.400 cm/s LVOT VTI:  0.083 m LVOT/AV VTI ratio: 0.15 AI PHT:            634 msec  AORTA Ao Root diam: 3.30 cm MV E velocity: 95.17 cm/s  TRICUSPID VALVE                            TR Peak grad:   29.8 mmHg                            TR Vmax:        273.00 cm/s                             SHUNTS                            Systemic VTI:  0.08 m                             Systemic Diam: 1.80 cm Serafina Royals MD Electronically signed by Serafina Royals MD Signature Date/Time: 03/20/2020/6:58:39 AM    Final      Assessment and Recommendation  79 y.o. male with known chronic kidney disease stage III hypertension borderline hyperlipidemia chronic nonvalvular atrial fibrillation with left bundle branch block with acute poor cardiac output congestive heart failure likely secondary to progression of severe aortic valve stenosis with significant new change in LV function from 55% last year to 25% with dilation and new left bundle branch block at this time causing most of the symptoms without evidence of acute coronary syndrome. 1.  After long discussion with the patient and other consultants we will plan on transfer to [facility] for further more intense invasive evaluation of heart failure with severe aortic stenosis for the possibility of TAVR 2.  Discontinuation of Eliquis at this time due to concerns of bleeding complications for need in future procedures including cardiac catheterization to assess coronary anatomy and extent of aortic valve stenosis for the possibility of aortic valve replacement now at a higher risk due to severe LV systolic dysfunction 3.  Continuation of medication management for continued stability of symptoms including metoprolol at 12.5 mg twice per day.  Would consider increasing for better heart rate control and symptom relief  Signed, Serafina Royals M.D. FACC

## 2020-03-20 NOTE — Care Management Important Message (Signed)
Important Message  Patient Details  Name: LEVII HAIRFIELD MRN: [id number] Date of Birth: 02-18-1941   Medicare Important Message Given:  Yes     Dannette Barbara 03/20/2020, 2:57 PM

## 2020-03-20 NOTE — Progress Notes (Signed)
PROGRESS NOTE    Ross Mccann  KGU:[id number] DOB: 04-08-1941 DOA: 03/17/2020 PCP: Tracie Harrier, MD   Brief Narrative: Taken from prior notes. 79 year old Asian male with a history of aortic stenosis (severe in in 2019 with a estimated valve area of 0.86,peak gradient 61 mmHg),history of A. fib on chronic anticoagulation, hypertension, history of chronic diastolic heart failure presents to the ER today with a several week history of worsening dyspnea on exertion. Patient is now at the point where he can walk no more than 10 to 20 feet without becoming extremely short of breath.  Chest x-ray within normal limit.  Elevated troponin and BNP.  EKG with controlled atrial fibrillation. Cardiology was consulted by ER provider and he was admitted at the request.  Subjective: Patient was resting comfortably when seen today.  Wife was at bedside.  Had a discussion about his newly diagnosed low EF with severe aortic stenosis and his need to go to a tertiary care center.  Both patient and wife agrees.  Assessment & Plan:   Principal Problem:   Severe aortic stenosis Active Problems:   Chronic atrial fibrillation (HCC)   Chronic diastolic CHF (congestive heart failure) (HCC)   Hypertension   Hyperlipidemia, unspecified   Chronic anticoagulation - on Eliquis for afib  Worsening dyspnea on exertion.  Most likely secondary to aortic stenosis.  VQ scan negative for PE, which was done with elevated D-dimer. Appears euvolemic.  Prior echocardiogram done in 2019 with moderate aortic stenosis.  Repeat echocardiogram done yesterday with EF of 25%, dilated heart and severe aortic stenosis. Discussed with cardiology and they are recommending tertiary care center. He is being accepted by [facility] cardiology, Dr. Teodoro Kil, bed request was placed by cardiology, he will be transferred to [facility] once bed is available.  AKI with CKD stage IIIb.  Some improvement in his creatinine with gentle IV fluid.   We will discontinue IV fluid and encourage p.o. hydration today due to severely reduced EF and holding Lasix at this time. -Continue to monitor. -Avoid nephrotoxins.  New diagnosis of HFrEF.  EF of 25% on echo done yesterday.  Elevated BNP but clinically appears euvolemic.  Patient also has CKD and aortic stenosis.  Home dose of Lasix was held for concern of AKI. -Closely monitor volume status.  Chronic persistent atrial fibrillation.  Heart rate well controlled. -Continue home dose of Toprol and Eliquis.  Hypertension.  Blood pressure within goal. -Continue home dose of Toprol. -Holding Lasix.  Hyperlipidemia. -Continue home dose of statin.  Objective: Vitals:   03/20/20 0500 03/20/20 0556 03/20/20 0730 03/20/20 1517  BP: (!) 113/92  (!) 127/106 (!) 126/107  Pulse: 65  87 90  Resp: 16  16 19   Temp: (!) 97.4 F (36.3 C)  (!) 97.5 F (36.4 C) 97.8 F (36.6 C)  TempSrc: Oral  Oral Axillary  SpO2: 100%  100% 100%  Weight:  74.4 kg    Height:        Intake/Output Summary (Last 24 hours) at 03/20/2020 1521 Last data filed at 03/20/2020 1000 Gross per 24 hour  Intake 240 ml  Output 0 ml  Net 240 ml   Filed Weights   03/17/20 1531 03/19/20 0515 03/20/20 0556  Weight: 72.6 kg 73.3 kg 74.4 kg    Examination:  General exam: Appears calm and comfortable  Respiratory system: Clear to auscultation. Respiratory effort normal. Cardiovascular system: S1 & S2 heard, irregular with regular rate.  Murmur positive. Gastrointestinal system: Soft, nontender, nondistended, bowel  sounds positive. Central nervous system: Alert and oriented. No focal neurological deficits.Symmetric 5 x 5 power. Extremities: No edema, no cyanosis, pulses intact and symmetrical. Psychiatry: Judgement and insight appear normal. Mood & affect appropriate.    DVT prophylaxis: Eliquis Code Status: Full Family Communication: Wife was updated at bedside. Disposition Plan:  Status is: Inpatient  Remains  inpatient appropriate because:Inpatient level of care appropriate due to severity of illness   Dispo: The patient is from: Home              Anticipated d/c is to: [facility] once bed is available.              Anticipated d/c date is: 1 day              Patient currently is not medically stable to d/c.    Consultants:   Cardiology  Procedures:  Antimicrobials:   Data Reviewed: I have personally reviewed following labs and imaging studies  CBC: Recent Labs  Lab 03/17/20 1535 03/18/20 0452 03/19/20 0419  WBC 6.6 6.8 7.7  NEUTROABS  --  4.4  --   HGB 14.0 13.1 14.0  HCT 45.8 41.0 44.5  MCV 68.9* 68.2* 68.1*  PLT 187 168 767   Basic Metabolic Panel: Recent Labs  Lab 03/17/20 1535 03/18/20 0452 03/19/20 0419 03/20/20 0334  NA 137 140 140 139  K 4.4 4.7 4.4 4.5  CL 103 105 105 106  CO2 23 24 24 22   GLUCOSE 118* 113* 121* 107*  BUN 47* 49* 56* 61*  CREATININE 1.69* 1.78* 1.95* 1.84*  CALCIUM 9.5 9.6 9.6 9.1  MG  --   --  2.7*  --    GFR: Estimated Creatinine Clearance: 29.4 mL/min (A) (by C-G formula based on SCr of 1.84 mg/dL (H)). Liver Function Tests: Recent Labs  Lab 03/17/20 2031 03/18/20 0452  AST 46* 41  ALT 31 31  ALKPHOS 104 93  BILITOT 2.1* 2.0*  PROT 7.4 7.1  ALBUMIN 4.1 3.7   No results for input(s): LIPASE, AMYLASE in the last 168 hours. No results for input(s): AMMONIA in the last 168 hours. Coagulation Profile: No results for input(s): INR, PROTIME in the last 168 hours. Cardiac Enzymes: No results for input(s): CKTOTAL, CKMB, CKMBINDEX, TROPONINI in the last 168 hours. BNP (last 3 results) No results for input(s): PROBNP in the last 8760 hours. HbA1C: No results for input(s): HGBA1C in the last 72 hours. CBG: No results for input(s): GLUCAP in the last 168 hours. Lipid Profile: No results for input(s): CHOL, HDL, LDLCALC, TRIG, CHOLHDL, LDLDIRECT in the last 72 hours. Thyroid Function Tests: No results for input(s): TSH,  T4TOTAL, FREET4, T3FREE, THYROIDAB in the last 72 hours. Anemia Panel: No results for input(s): VITAMINB12, FOLATE, FERRITIN, TIBC, IRON, RETICCTPCT in the last 72 hours. Sepsis Labs: No results for input(s): PROCALCITON, LATICACIDVEN in the last 168 hours.  Recent Results (from the past 240 hour(s))  Resp Panel by RT-PCR (Flu A&B, Covid) Nasopharyngeal Swab     Status: None   Collection Time: 03/17/20  8:31 PM   Specimen: Nasopharyngeal Swab; Nasopharyngeal(NP) swabs in vial transport medium  Result Value Ref Range Status   SARS Coronavirus 2 by RT PCR NEGATIVE NEGATIVE Final    Comment: (NOTE) SARS-CoV-2 target nucleic acids are NOT DETECTED.  The SARS-CoV-2 RNA is generally detectable in upper respiratory specimens during the acute phase of infection. The lowest concentration of SARS-CoV-2 viral copies this assay can detect is 138 copies/mL. A  negative result does not preclude SARS-Cov-2 infection and should not be used as the sole basis for treatment or other patient management decisions. A negative result may occur with  improper specimen collection/handling, submission of specimen other than nasopharyngeal swab, presence of viral mutation(s) within the areas targeted by this assay, and inadequate number of viral copies(<138 copies/mL). A negative result must be combined with clinical observations, patient history, and epidemiological information. The expected result is Negative.  Fact Sheet for Patients:  EntrepreneurPulse.com.au  Fact Sheet for Healthcare Providers:  IncredibleEmployment.be  This test is no t yet approved or cleared by the [place] FDA and  has been authorized for detection and/or diagnosis of SARS-CoV-2 by FDA under an Emergency Use Authorization (EUA). This EUA will remain  in effect (meaning this test can be used) for the duration of the COVID-19 declaration under Section 564(b)(1) of the Act, 21 U.S.C.section  360bbb-3(b)(1), unless the authorization is terminated  or revoked sooner.       Influenza A by PCR NEGATIVE NEGATIVE Final   Influenza B by PCR NEGATIVE NEGATIVE Final    Comment: (NOTE) The Xpert Xpress SARS-CoV-2/FLU/RSV plus assay is intended as an aid in the diagnosis of influenza from Nasopharyngeal swab specimens and should not be used as a sole basis for treatment. Nasal washings and aspirates are unacceptable for Xpert Xpress SARS-CoV-2/FLU/RSV testing.  Fact Sheet for Patients: EntrepreneurPulse.com.au  Fact Sheet for Healthcare Providers: IncredibleEmployment.be  This test is not yet approved or cleared by the [place] FDA and has been authorized for detection and/or diagnosis of SARS-CoV-2 by FDA under an Emergency Use Authorization (EUA). This EUA will remain in effect (meaning this test can be used) for the duration of the COVID-19 declaration under Section 564(b)(1) of the Act, 21 U.S.C. section 360bbb-3(b)(1), unless the authorization is terminated or revoked.  Performed at [facility], [address], [place], [place] [address]      Radiology Studies: DG Chest [facility] 1 View  Result Date: 03/20/2020 CLINICAL DATA:  Congestive heart failure EXAM: PORTABLE CHEST 1 VIEW COMPARISON:  March 17, 2020 FINDINGS: There is no edema or airspace opacity. Heart is enlarged with pulmonary vascularity within normal limits. No adenopathy. There is aortic atherosclerosis. No bone lesions IMPRESSION: Stable cardiac enlargement. No edema or airspace opacity. Aortic Atherosclerosis (ICD10-I70.0). Electronically Signed   By: Lowella Grip III M.D.   On: 03/20/2020 07:54   ECHOCARDIOGRAM COMPLETE  Result Date: 03/20/2020    ECHOCARDIOGRAM REPORT   Patient Name:   Ross Mccann Date of Exam: 03/19/2020 Medical Rec #:  [id number]          Height:       67.0 in Accession #:    [id number]         Weight:       161.6 lb Date of  Birth:  1940-05-19          BSA:          1.847 m Patient Age:    8 years           BP:           114/101 mmHg Patient Gender: M                  HR:           102 bpm. Exam Location:  [facility] Procedure: 2D Echo, Cardiac Doppler, Color Doppler and Intracardiac            Opacification  Agent Indications:     R01.2 Abnormal Heart Sounds  History:         Patient has prior history of Echocardiogram examinations, most                  recent 03/30/2018. Risk Factors:Hypertension and Dyslipidemia.                  Atrial Fibrillation. Chronic kidney disease.  Sonographer:     Wilford Sports Rodgers-Jones Referring Phys:  [id number] Kendria Halberg Diagnosing Phys: Serafina Royals MD IMPRESSIONS  1. Left ventricular ejection fraction, by estimation, is 25 to 30%. The left ventricle has severely decreased function. The left ventricle demonstrates global hypokinesis. The left ventricular internal cavity size was moderately dilated. There is moderate left ventricular hypertrophy. Left ventricular diastolic function could not be evaluated.  2. Right ventricular systolic function is normal. The right ventricular size is normal.  3. Left atrial size was moderately dilated.  4. Right atrial size was moderately dilated.  5. The mitral valve is normal in structure. Moderate to severe mitral valve regurgitation.  6. 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  7. The aortic valve is tricuspid. Aortic valve regurgitation is mild. Severe aortic valve stenosis. FINDINGS  Left Ventricle: Left ventricular ejection fraction, by estimation, is 25 to 30%. The left ventricle has severely decreased function. The left ventricle demonstrates global hypokinesis. Definity contrast agent was given IV to delineate the left ventricular endocardial borders. The left ventricular internal cavity size was moderately dilated. There is moderate left ventricular hypertrophy. Left ventricular diastolic function could not be evaluated. Right Ventricle: The right  ventricular size is normal. No increase in right ventricular wall thickness. Right ventricular systolic function is normal. Left Atrium: Left atrial size was moderately dilated. Right Atrium: Right atrial size was moderately dilated. Pericardium: There is no evidence of pericardial effusion. Mitral Valve: The mitral valve is normal in structure. Moderate to severe mitral valve regurgitation. Tricuspid Valve: The tricuspid valve is normal in structure. 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 Aortic Valve: The aortic valve is tricuspid. Aortic valve regurgitation is mild. Aortic regurgitation PHT measures 634 msec. Severe aortic stenosis is present. Aortic valve mean gradient measures 23.4 mmHg. Aortic valve peak gradient measures 36.8 mmHg. Aortic valve area, by VTI measures 0.38 cm. Pulmonic Valve: The pulmonic valve was normal in structure. Pulmonic valve regurgitation is trivial. Aorta: The aortic root and ascending aorta are structurally normal, with no evidence of dilitation. IAS/Shunts: No atrial level shunt detected by color flow Doppler.  LEFT VENTRICLE PLAX 2D LVIDd:         5.17 cm LVIDs:         4.77 cm LV PW:         1.09 cm LV IVS:        1.12 cm LVOT diam:     1.80 cm LV SV:         21 LV SV Index:   11 LVOT Area:     2.54 cm  RIGHT VENTRICLE            IVC RV Basal diam:  4.70 cm    IVC diam: 2.50 cm RV S prime:     7.54 cm/s TAPSE (M-mode): 0.7 cm LEFT ATRIUM             Index       RIGHT ATRIUM           Index LA diam:  5.50 cm 2.98 cm/m  RA Area:     29.10 cm LA Vol (A2C):   70.7 ml 38.28 ml/m RA Volume:   113.00 ml 61.18 ml/m LA Vol (A4C):   51.8 ml 28.04 ml/m LA Biplane Vol: 61.5 ml 33.30 ml/m  AORTIC VALVE AV Area (Vmax):    0.32 cm AV Area (Vmean):   0.34 cm AV Area (VTI):     0.38 cm AV Vmax:           303.20 cm/s AV Vmean:          226.200 cm/s AV VTI:            0.555 m AV Peak Grad:      36.8 mmHg AV Mean Grad:      23.4 mmHg LVOT Vmax:         38.20 cm/s LVOT  Vmean:        30.400 cm/s LVOT VTI:          0.083 m LVOT/AV VTI ratio: 0.15 AI PHT:            634 msec  AORTA Ao Root diam: 3.30 cm MV E velocity: 95.17 cm/s  TRICUSPID VALVE                            TR Peak grad:   29.8 mmHg                            TR Vmax:        273.00 cm/s                             SHUNTS                            Systemic VTI:  0.08 m                            Systemic Diam: 1.80 cm Serafina Royals MD Electronically signed by Serafina Royals MD Signature Date/Time: 03/20/2020/6:58:39 AM    Final     Scheduled Meds: . metoprolol succinate  12.5 mg Oral Daily  . simvastatin  40 mg Oral [id number]   Continuous Infusions:    LOS: 3 days   Time spent: 35 minutes.  Lorella Nimrod, MD [facility]  If 7PM-7AM, please contact night-coverage [email or web address]  03/20/2020, 3:21 PM   This record has been created using Systems analyst. Errors have been sought and corrected,but may not always be located. Such creation errors do not reflect on the standard of care.

## 2020-03-20 NOTE — Discharge Summary (Signed)
Physician Discharge Summary  ARIEON CORCORAN VEH:[id number] DOB: 1940/07/31 DOA: 03/17/2020  PCP: Tracie Harrier, MD  Admit date: 03/17/2020 Discharge date: 03/20/2020  Admitted From: Home Disposition: [facility]  Recommendations for Outpatient Follow-up:  1. Follow up with PCP in 1-2 weeks 2. Please obtain BMP/CBC in one week 3. Please follow up on the following pending results:   Brief/Interim Summary: 79 year old Asian male with a history of aortic stenosis (severe in in 2019 with a estimated valve area of 0.86,peak gradient 61 mmHg),history of A. fib on chronic anticoagulation, hypertension, history of chronic diastolic heart failure presents to the ER today with a several week history of worsening dyspnea on exertion. Patient is now at the point where he can walk no more than 10 to 20 feet without becoming extremely short of breath.  Chest x-ray within normal limit.  Elevated troponin and BNP.  EKG with controlled atrial fibrillation. VQ scan was negative for PE.  Patient has a prior echocardiogram done in 2019 with moderate aortic stenosis.  Repeat echocardiogram done yesterday with EF of 25%, dilated heart and severe aortic stenosis. Discussed with cardiology and they are recommending tertiary care center. He is being accepted by [facility] cardiology, Dr. Teodoro Kil, bed request was placed by cardiology, he will be transferred to [facility] for tertiary care.  Patient has an history of CKD stage IIIb, due to AKI his home dose of Lasix was held and he was giving gentle IV fluid with some improvement in his creatinine.  He will continue with rest of his medications and his care is being transferred to [facility] for further management.  Discharge Diagnoses:  Principal Problem:   Severe aortic stenosis Active Problems:   Chronic atrial fibrillation (HCC)   Chronic diastolic CHF (congestive heart failure) (HCC)   Hypertension   Hyperlipidemia, unspecified   Chronic anticoagulation -  on Eliquis for afib   Discharge Instructions  Discharge Instructions    Diet - low sodium heart healthy   Complete by: As directed    Increase activity slowly   Complete by: As directed      Allergies as of 03/20/2020   No Known Allergies     Medication List    TAKE these medications   bicalutamide 50 MG tablet Commonly known as: CASODEX Take 1 tablet (50 mg total) by mouth daily.   CENTRUM SILVER 50+MEN PO Take 1 tablet by mouth 3 (three) times a week.   Eliquis 5 MG Tabs tablet Generic drug: apixaban Take 2.5 mg by mouth 2 (two) times daily.   folic acid 836 MCG tablet Commonly known as: FOLVITE Take 800 mcg by mouth daily.   furosemide 20 MG tablet Commonly known as: LASIX Take 20 mg by mouth daily.   meloxicam 7.5 MG tablet Commonly known as: MOBIC Take 7.5 mg by mouth daily as needed for pain.   metoprolol succinate 25 MG 24 hr tablet Commonly known as: TOPROL-XL Take 0.5 tablets (12.5 mg total) by mouth daily.   potassium chloride 10 MEQ tablet Commonly known as: KLOR-CON Take 10 mEq by mouth daily.   PROBIOTIC PO Take 1 capsule by mouth 3 (three) times a week.   simvastatin 40 MG tablet Commonly known as: ZOCOR Take 40 mg by mouth every morning.       No Known Allergies  Consultations:  Cardiology  Procedures/Studies: DG Chest 2 View  Result Date: 03/17/2020 CLINICAL DATA:  79 year old male with shortness of breath EXAM: CHEST - 2 VIEW COMPARISON:  Chest radiograph  dated 06/10/2018. FINDINGS: Diffuse interstitial coarsening and mild chronic bronchitic changes. No focal consolidation, pleural effusion or pneumothorax. Top-normal cardiac silhouette. Atherosclerotic calcification of the aorta. No acute osseous pathology. IMPRESSION: No active cardiopulmonary disease. Electronically Signed   By: Anner Crete M.D.   On: 03/17/2020 16:05   NM Pulmonary Perfusion  Result Date: 03/18/2020 CLINICAL DATA:  Worsening shortness of breath over  last 4-5 days EXAM: NUCLEAR MEDICINE PERFUSION LUNG SCAN TECHNIQUE: Perfusion images were obtained in multiple projections after intravenous injection of radiopharmaceutical. Ventilation scans intentionally deferred if perfusion scan and chest x-ray adequate for interpretation during COVID 19 epidemic. RADIOPHARMACEUTICALS:  4.34 mCi Tc-7m MAA IV COMPARISON:  03/17/2020 FINDINGS: Normal symmetrical distribution of radiotracer bilaterally. There are no perfusion defects suspicious for pulmonary embolus. IMPRESSION: 1. No evidence of pulmonary embolus on perfusion imaging. Electronically Signed   By: Randa Ngo M.D.   On: 03/18/2020 16:36   DG Chest Port 1 View  Result Date: 03/20/2020 CLINICAL DATA:  Congestive heart failure EXAM: PORTABLE CHEST 1 VIEW COMPARISON:  March 17, 2020 FINDINGS: There is no edema or airspace opacity. Heart is enlarged with pulmonary vascularity within normal limits. No adenopathy. There is aortic atherosclerosis. No bone lesions IMPRESSION: Stable cardiac enlargement. No edema or airspace opacity. Aortic Atherosclerosis (ICD10-I70.0). Electronically Signed   By: Lowella Grip III M.D.   On: 03/20/2020 07:54   ECHOCARDIOGRAM COMPLETE  Result Date: 03/20/2020    ECHOCARDIOGRAM REPORT   Patient Name:   Ross MITTLEMAN Date of Exam: 03/19/2020 Medical Rec #:  [id number]          Height:       67.0 in Accession #:    [id number]         Weight:       161.6 lb Date of Birth:  1940-10-17          BSA:          1.847 m Patient Age:    1 years           BP:           114/101 mmHg Patient Gender: M                  HR:           102 bpm. Exam Location:  [facility] Procedure: 2D Echo, Cardiac Doppler, Color Doppler and Intracardiac            Opacification Agent Indications:     R01.2 Abnormal Heart Sounds  History:         Patient has prior history of Echocardiogram examinations, most                  recent 03/30/2018. Risk Factors:Hypertension and Dyslipidemia.                   Atrial Fibrillation. Chronic kidney disease.  Sonographer:     Wilford Sports Rodgers-Jones Referring Phys:  [id number] BULAGTX Mitzie Marlar Diagnosing Phys: Serafina Royals MD IMPRESSIONS  1. Left ventricular ejection fraction, by estimation, is 25 to 30%. The left ventricle has severely decreased function. The left ventricle demonstrates global hypokinesis. The left ventricular internal cavity size was moderately dilated. There is moderate left ventricular hypertrophy. Left ventricular diastolic function could not be evaluated.  2. Right ventricular systolic function is normal. The right ventricular size is normal.  3. Left atrial size was moderately dilated.  4. Right atrial size was moderately dilated.  5. The mitral valve is  normal in structure. Moderate to severe mitral valve regurgitation.  6. 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  7. The aortic valve is tricuspid. Aortic valve regurgitation is mild. Severe aortic valve stenosis. FINDINGS  Left Ventricle: Left ventricular ejection fraction, by estimation, is 25 to 30%. The left ventricle has severely decreased function. The left ventricle demonstrates global hypokinesis. Definity contrast agent was given IV to delineate the left ventricular endocardial borders. The left ventricular internal cavity size was moderately dilated. There is moderate left ventricular hypertrophy. Left ventricular diastolic function could not be evaluated. Right Ventricle: The right ventricular size is normal. No increase in right ventricular wall thickness. Right ventricular systolic function is normal. Left Atrium: Left atrial size was moderately dilated. Right Atrium: Right atrial size was moderately dilated. Pericardium: There is no evidence of pericardial effusion. Mitral Valve: The mitral valve is normal in structure. Moderate to severe mitral valve regurgitation. Tricuspid Valve: The tricuspid valve is normal in structure. 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  Aortic Valve: The aortic valve is tricuspid. Aortic valve regurgitation is mild. Aortic regurgitation PHT measures 634 msec. Severe aortic stenosis is present. Aortic valve mean gradient measures 23.4 mmHg. Aortic valve peak gradient measures 36.8 mmHg. Aortic valve area, by VTI measures 0.38 cm. Pulmonic Valve: The pulmonic valve was normal in structure. Pulmonic valve regurgitation is trivial. Aorta: The aortic root and ascending aorta are structurally normal, with no evidence of dilitation. IAS/Shunts: No atrial level shunt detected by color flow Doppler.  LEFT VENTRICLE PLAX 2D LVIDd:         5.17 cm LVIDs:         4.77 cm LV PW:         1.09 cm LV IVS:        1.12 cm LVOT diam:     1.80 cm LV SV:         21 LV SV Index:   11 LVOT Area:     2.54 cm  RIGHT VENTRICLE            IVC RV Basal diam:  4.70 cm    IVC diam: 2.50 cm RV S prime:     7.54 cm/s TAPSE (M-mode): 0.7 cm LEFT ATRIUM             Index       RIGHT ATRIUM           Index LA diam:        5.50 cm 2.98 cm/m  RA Area:     29.10 cm LA Vol (A2C):   70.7 ml 38.28 ml/m RA Volume:   113.00 ml 61.18 ml/m LA Vol (A4C):   51.8 ml 28.04 ml/m LA Biplane Vol: 61.5 ml 33.30 ml/m  AORTIC VALVE AV Area (Vmax):    0.32 cm AV Area (Vmean):   0.34 cm AV Area (VTI):     0.38 cm AV Vmax:           303.20 cm/s AV Vmean:          226.200 cm/s AV VTI:            0.555 m AV Peak Grad:      36.8 mmHg AV Mean Grad:      23.4 mmHg LVOT Vmax:         38.20 cm/s LVOT Vmean:        30.400 cm/s LVOT VTI:          0.083 m LVOT/AV VTI ratio: 0.15  AI PHT:            634 msec  AORTA Ao Root diam: 3.30 cm MV E velocity: 95.17 cm/s  TRICUSPID VALVE                            TR Peak grad:   29.8 mmHg                            TR Vmax:        273.00 cm/s                             SHUNTS                            Systemic VTI:  0.08 m                            Systemic Diam: 1.80 cm Serafina Royals MD Electronically signed by Serafina Royals MD Signature Date/Time:  03/20/2020/6:58:39 AM    Final      Subjective: Patient was resting comfortably when seen today.  Wife was at bedside.  Had a discussion about his newly diagnosed low EF with severe aortic stenosis and his need to go to a tertiary care center.  Both patient and wife agrees.  Discharge Exam: Vitals:   03/20/20 0730 03/20/20 1517  BP: (!) 127/106 (!) 126/107  Pulse: 87 90  Resp: 16 19  Temp: (!) 97.5 F (36.4 C) 97.8 F (36.6 C)  SpO2: 100% 100%   Vitals:   03/20/20 0500 03/20/20 0556 03/20/20 0730 03/20/20 1517  BP: (!) 113/92  (!) 127/106 (!) 126/107  Pulse: 65  87 90  Resp: 16  16 19   Temp: (!) 97.4 F (36.3 C)  (!) 97.5 F (36.4 C) 97.8 F (36.6 C)  TempSrc: Oral  Oral Axillary  SpO2: 100%  100% 100%  Weight:  74.4 kg    Height:        General: Pt is alert, awake, not in acute distress Cardiovascular: Irregularly irregular with positive murmur Respiratory: CTA bilaterally, no wheezing, no rhonchi Abdominal: Soft, NT, ND, bowel sounds + Extremities: no edema, no cyanosis   The results of significant diagnostics from this hospitalization (including imaging, microbiology, ancillary and laboratory) are listed below for reference.    Microbiology: Recent Results (from the past 240 hour(s))  Resp Panel by RT-PCR (Flu A&B, Covid) Nasopharyngeal Swab     Status: None   Collection Time: 03/17/20  8:31 PM   Specimen: Nasopharyngeal Swab; Nasopharyngeal(NP) swabs in vial transport medium  Result Value Ref Range Status   SARS Coronavirus 2 by RT PCR NEGATIVE NEGATIVE Final    Comment: (NOTE) SARS-CoV-2 target nucleic acids are NOT DETECTED.  The SARS-CoV-2 RNA is generally detectable in upper respiratory specimens during the acute phase of infection. The lowest concentration of SARS-CoV-2 viral copies this assay can detect is 138 copies/mL. A negative result does not preclude SARS-Cov-2 infection and should not be used as the sole basis for treatment or other patient  management decisions. A negative result may occur with  improper specimen collection/handling, submission of specimen other than nasopharyngeal swab, presence of viral mutation(s) within the areas targeted by this assay, and inadequate number of viral copies(<138 copies/mL). A  negative result must be combined with clinical observations, patient history, and epidemiological information. The expected result is Negative.  Fact Sheet for Patients:  EntrepreneurPulse.com.au  Fact Sheet for Healthcare Providers:  IncredibleEmployment.be  This test is no t yet approved or cleared by the [place] FDA and  has been authorized for detection and/or diagnosis of SARS-CoV-2 by FDA under an Emergency Use Authorization (EUA). This EUA will remain  in effect (meaning this test can be used) for the duration of the COVID-19 declaration under Section 564(b)(1) of the Act, 21 U.S.C.section 360bbb-3(b)(1), unless the authorization is terminated  or revoked sooner.       Influenza A by PCR NEGATIVE NEGATIVE Final   Influenza B by PCR NEGATIVE NEGATIVE Final    Comment: (NOTE) The Xpert Xpress SARS-CoV-2/FLU/RSV plus assay is intended as an aid in the diagnosis of influenza from Nasopharyngeal swab specimens and should not be used as a sole basis for treatment. Nasal washings and aspirates are unacceptable for Xpert Xpress SARS-CoV-2/FLU/RSV testing.  Fact Sheet for Patients: EntrepreneurPulse.com.au  Fact Sheet for Healthcare Providers: IncredibleEmployment.be  This test is not yet approved or cleared by the [place] FDA and has been authorized for detection and/or diagnosis of SARS-CoV-2 by FDA under an Emergency Use Authorization (EUA). This EUA will remain in effect (meaning this test can be used) for the duration of the COVID-19 declaration under Section 564(b)(1) of the Act, 21 U.S.C. section 360bbb-3(b)(1),  unless the authorization is terminated or revoked.  Performed at [facility], [address] [place], [place] [address]      Labs: BNP (last 3 results) Recent Labs    03/17/20 2031  BNP 5,400.8*   Basic Metabolic Panel: Recent Labs  Lab 03/17/20 1535 03/18/20 0452 03/19/20 0419 03/20/20 0334  NA 137 140 140 139  K 4.4 4.7 4.4 4.5  CL 103 105 105 106  CO2 23 24 24 22   GLUCOSE 118* 113* 121* 107*  BUN 47* 49* 56* 61*  CREATININE 1.69* 1.78* 1.95* 1.84*  CALCIUM 9.5 9.6 9.6 9.1  MG  --   --  2.7*  --    Liver Function Tests: Recent Labs  Lab 03/17/20 2031 03/18/20 0452  AST 46* 41  ALT 31 31  ALKPHOS 104 93  BILITOT 2.1* 2.0*  PROT 7.4 7.1  ALBUMIN 4.1 3.7   No results for input(s): LIPASE, AMYLASE in the last 168 hours. No results for input(s): AMMONIA in the last 168 hours. CBC: Recent Labs  Lab 03/17/20 1535 03/18/20 0452 03/19/20 0419  WBC 6.6 6.8 7.7  NEUTROABS  --  4.4  --   HGB 14.0 13.1 14.0  HCT 45.8 41.0 44.5  MCV 68.9* 68.2* 68.1*  PLT 187 168 189   Cardiac Enzymes: No results for input(s): CKTOTAL, CKMB, CKMBINDEX, TROPONINI in the last 168 hours. BNP: Invalid input(s): POCBNP CBG: No results for input(s): GLUCAP in the last 168 hours. D-Dimer Recent Labs    03/17/20 2031  DDIMER 2.10*   Hgb A1c No results for input(s): HGBA1C in the last 72 hours. Lipid Profile No results for input(s): CHOL, HDL, LDLCALC, TRIG, CHOLHDL, LDLDIRECT in the last 72 hours. Thyroid function studies No results for input(s): TSH, T4TOTAL, T3FREE, THYROIDAB in the last 72 hours.  Invalid input(s): FREET3 Anemia work up No results for input(s): VITAMINB12, FOLATE, FERRITIN, TIBC, IRON, RETICCTPCT in the last 72 hours. Urinalysis    Component Value Date/Time   COLORURINE YELLOW (A) 03/17/2020 1535   APPEARANCEUR  HAZY (A) 03/17/2020 1535   LABSPEC 1.019 03/17/2020 1535   PHURINE 5.0 03/17/2020 1535   GLUCOSEU NEGATIVE 03/17/2020 1535    HGBUR LARGE (A) 03/17/2020 1535   BILIRUBINUR NEGATIVE 03/17/2020 1535   KETONESUR NEGATIVE 03/17/2020 1535   PROTEINUR >=300 (A) 03/17/2020 1535   NITRITE NEGATIVE 03/17/2020 1535   LEUKOCYTESUR NEGATIVE 03/17/2020 1535   Sepsis Labs Invalid input(s): PROCALCITONIN,  WBC,  LACTICIDVEN Microbiology Recent Results (from the past 240 hour(s))  Resp Panel by RT-PCR (Flu A&B, Covid) Nasopharyngeal Swab     Status: None   Collection Time: 03/17/20  8:31 PM   Specimen: Nasopharyngeal Swab; Nasopharyngeal(NP) swabs in vial transport medium  Result Value Ref Range Status   SARS Coronavirus 2 by RT PCR NEGATIVE NEGATIVE Final    Comment: (NOTE) SARS-CoV-2 target nucleic acids are NOT DETECTED.  The SARS-CoV-2 RNA is generally detectable in upper respiratory specimens during the acute phase of infection. The lowest concentration of SARS-CoV-2 viral copies this assay can detect is 138 copies/mL. A negative result does not preclude SARS-Cov-2 infection and should not be used as the sole basis for treatment or other patient management decisions. A negative result may occur with  improper specimen collection/handling, submission of specimen other than nasopharyngeal swab, presence of viral mutation(s) within the areas targeted by this assay, and inadequate number of viral copies(<138 copies/mL). A negative result must be combined with clinical observations, patient history, and epidemiological information. The expected result is Negative.  Fact Sheet for Patients:  EntrepreneurPulse.com.au  Fact Sheet for Healthcare Providers:  IncredibleEmployment.be  This test is no t yet approved or cleared by the [place] FDA and  has been authorized for detection and/or diagnosis of SARS-CoV-2 by FDA under an Emergency Use Authorization (EUA). This EUA will remain  in effect (meaning this test can be used) for the duration of the COVID-19 declaration under  Section 564(b)(1) of the Act, 21 U.S.C.section 360bbb-3(b)(1), unless the authorization is terminated  or revoked sooner.       Influenza A by PCR NEGATIVE NEGATIVE Final   Influenza B by PCR NEGATIVE NEGATIVE Final    Comment: (NOTE) The Xpert Xpress SARS-CoV-2/FLU/RSV plus assay is intended as an aid in the diagnosis of influenza from Nasopharyngeal swab specimens and should not be used as a sole basis for treatment. Nasal washings and aspirates are unacceptable for Xpert Xpress SARS-CoV-2/FLU/RSV testing.  Fact Sheet for Patients: EntrepreneurPulse.com.au  Fact Sheet for Healthcare Providers: IncredibleEmployment.be  This test is not yet approved or cleared by the [place] FDA and has been authorized for detection and/or diagnosis of SARS-CoV-2 by FDA under an Emergency Use Authorization (EUA). This EUA will remain in effect (meaning this test can be used) for the duration of the COVID-19 declaration under Section 564(b)(1) of the Act, 21 U.S.C. section 360bbb-3(b)(1), unless the authorization is terminated or revoked.  Performed at [facility], [address] [place], [place] [address]     Time coordinating discharge: Over 30 minutes  SIGNED:  Lorella Nimrod, MD  [facility] 03/20/2020, 4:26 PM  If 7PM-7AM, please contact night-coverage [email or web address]  This record has been created using Systems analyst. Errors have been sought and corrected,but may not always be located. Such creation errors do not reflect on the standard of care.

## 2020-03-20 NOTE — Progress Notes (Addendum)
Orders to transfer to [facility] bed assignment given - rm [id number]/ report called to floor/ [facility] currently arranging transportation    17:40- Per [facility]- transportation will arrive after shift change

## 2020-03-20 NOTE — Progress Notes (Signed)
Mobility Specialist - Progress Note   03/20/20 1100  Mobility  Activity Ambulated in hall  Level of Assistance Standby assist, set-up cues, supervision of patient - no hands on  Assistive Device Front wheel walker  Distance Ambulated (ft) 160 ft  Mobility Response Tolerated well  Mobility performed by Mobility specialist  $Mobility charge 1 Mobility     Pre-mobility: 98 HR, 100% SpO2 During mobility: 47 HR, 97% SpO2 Post-mobility: 95 HR, 99% SpO2   Pt was sitting EOB upon arrival, utilizing room air, with family present in room. Pt agreed to session. Pt denied any pain, nausea, or fatigue. Pt stated he did not use AD PTA, but voiced that he "would not be able to walk without it now." Pt stood to RW with no physical assistance. Pt progressed to ambulation in the hallway (160 feet) with close supervision and no LOB. Pt denied SOB, weakness, or fatigue during activity. O2 > 95% throughout session RA. Upon return to EOB, pt c/o SOB and fatigue. Pt stated, "I felt fine until I sat down." Mobility went over PLB exercises with pt. Pt rated his RPE this session a "7/10". Overall, pt tolerated session well. Pt was left EOB with all needs in reach.    Kathee Delton Mobility Specialist 03/20/20, 11:54 AM

## 2020-09-15 ENCOUNTER — Emergency Department
Admission: EM | Admit: 2020-09-15 | Discharge: 2020-09-16 | Disposition: A | Discharge status: Home / Self Care | Payer: Medicare HMO | Attending: Emergency Medicine | Admitting: Emergency Medicine

## 2020-09-15 ENCOUNTER — Encounter: Payer: Self-pay | Admitting: Emergency Medicine

## 2020-09-15 ENCOUNTER — Emergency Department: Payer: Medicare HMO

## 2020-09-15 DIAGNOSIS — I130 Hypertensive heart and chronic kidney disease with heart failure and stage 1 through stage 4 chronic kidney disease, or unspecified chronic kidney disease: Secondary | ICD-10-CM | POA: Diagnosis not present

## 2020-09-15 DIAGNOSIS — Z8546 Personal history of malignant neoplasm of prostate: Secondary | ICD-10-CM | POA: Insufficient documentation

## 2020-09-15 DIAGNOSIS — I5032 Chronic diastolic (congestive) heart failure: Secondary | ICD-10-CM | POA: Diagnosis not present

## 2020-09-15 DIAGNOSIS — W010XXA Fall on same level from slipping, tripping and stumbling without subsequent striking against object, initial encounter: Secondary | ICD-10-CM | POA: Diagnosis not present

## 2020-09-15 DIAGNOSIS — Y92195 Garage of other specified residential institution as the place of occurrence of the external cause: Secondary | ICD-10-CM | POA: Insufficient documentation

## 2020-09-15 DIAGNOSIS — S61412A Laceration without foreign body of left hand, initial encounter: Secondary | ICD-10-CM

## 2020-09-15 DIAGNOSIS — Z23 Encounter for immunization: Secondary | ICD-10-CM | POA: Diagnosis not present

## 2020-09-15 DIAGNOSIS — S0083XA Contusion of other part of head, initial encounter: Secondary | ICD-10-CM

## 2020-09-15 DIAGNOSIS — S0990XA Unspecified injury of head, initial encounter: Secondary | ICD-10-CM | POA: Diagnosis present

## 2020-09-15 DIAGNOSIS — Z7901 Long term (current) use of anticoagulants: Secondary | ICD-10-CM | POA: Insufficient documentation

## 2020-09-15 DIAGNOSIS — R519 Headache, unspecified: Secondary | ICD-10-CM | POA: Insufficient documentation

## 2020-09-15 DIAGNOSIS — Z87891 Personal history of nicotine dependence: Secondary | ICD-10-CM | POA: Insufficient documentation

## 2020-09-15 DIAGNOSIS — S0121XA Laceration without foreign body of nose, initial encounter: Secondary | ICD-10-CM | POA: Diagnosis not present

## 2020-09-15 DIAGNOSIS — N183 Chronic kidney disease, stage 3 unspecified: Secondary | ICD-10-CM | POA: Insufficient documentation

## 2020-09-15 DIAGNOSIS — Y9301 Activity, walking, marching and hiking: Secondary | ICD-10-CM | POA: Insufficient documentation

## 2020-09-15 LAB — BASIC METABOLIC PANEL
Anion gap: 8 (ref 5–15)
BUN: 38 mg/dL — ABNORMAL HIGH (ref 8–23)
CO2: 24 mmol/L (ref 22–32)
Calcium: 9.3 mg/dL (ref 8.9–10.3)
Chloride: 102 mmol/L (ref 98–111)
Creatinine, Ser: 1.1 mg/dL (ref 0.61–1.24)
GFR, Estimated: >60 mL/min (ref >60)
Glucose, Bld: 123 mg/dL — ABNORMAL HIGH (ref 70–99)
Potassium: 4.8 mmol/L (ref 3.5–5.1)
Sodium: 134 mmol/L — ABNORMAL LOW (ref 135–145)

## 2020-09-15 LAB — TYPE AND SCREEN

## 2020-09-15 MED ORDER — LIDOCAINE-EPINEPHRINE 2 %-1:100000 IJ SOLN
30.0000 mL | Freq: Once | INTRAMUSCULAR | Status: AC
  Administered 2020-09-15: 30 mL via INTRADERMAL
  Filled 2020-09-15: qty 2

## 2020-09-15 MED ORDER — ACETAMINOPHEN 500 MG PO TABS
1000.0000 mg | ORAL_TABLET | Freq: Once | ORAL | Status: AC
  Administered 2020-09-15: 1000 mg via ORAL
  Filled 2020-09-15: qty 2

## 2020-09-15 MED ORDER — TETANUS-DIPHTH-ACELL PERTUSSIS 5-2.5-18.5 LF-MCG/0.5 IM SUSY
0.5000 mL | PREFILLED_SYRINGE | Freq: Once | INTRAMUSCULAR | Status: AC
  Administered 2020-09-15: 0.5 mL via INTRAMUSCULAR
  Filled 2020-09-15: qty 0.5

## 2020-09-15 NOTE — ED Triage Notes (Signed)
Pt reports slipping in garage and falling forward. Pt denies LOC. Pt unaware of what he hit or landed on when asked. Approx 4 inch laceration to the left top of hand, 1 inch laceration to the left side of nose and swelling noted with no redness over the right at the eyebrow. 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 New bandage applied with pressure due to continued bleeding.

## 2020-09-15 NOTE — ED Provider Notes (Signed)
[facility] Emergency Department Provider Note  ____________________________________________   Event Date/Time   First MD Initiated Contact with Patient 09/15/20 2039     (approximate)  I have reviewed the triage vital signs and the nursing notes.   HISTORY  Chief Complaint Fall   HPI Ross Mccann is a 80 y.o. male with a past medical history of A. fib on Eliquis most recently taking this evening, HTN, HDL, prostate cancer, and CKD on Eliquis who presents for assessment of some swelling around his right eye and a cut to his left nose and left hand after he states he stumbled while working in his garage immediately prior to arrival.  He denies any LOC endorses little bit of a headache and some soreness in his left hand but no other acute pain.  Denies any neck pain, back pain or abdominal pain or any recent sick symptoms including fevers, chills, cough, nausea, vomiting, diarrhea, dysuria, rash or other recent injuries or falls.  No other acute concerns at this time.  He does not recall when his last tetanus shot was.         Past Medical History:  Diagnosis Date  . Atrial fibrillation ([place])   . Cancer [facility])    prostate  . Chronic kidney disease    H/O KIDNEY STONES  . Dysrhythmia   . Gout   . History of kidney stones   . Hyperlipidemia   . Hypertension   . Prostate cancer [facility])     Patient Active Problem List   Diagnosis Date Noted  . Severe aortic stenosis 03/17/2020  . Hypertension 03/17/2020  . Hyperlipidemia, unspecified 03/17/2020  . Chronic anticoagulation - on Eliquis for afib 03/17/2020  . Stage 3b chronic kidney disease ([place]) 10/04/2019  . Chest pain 03/30/2018  . Chronic diastolic CHF (congestive heart failure) ([place]) 10/19/2017  . Chronic atrial fibrillation ([place]) 09/04/2015    Past Surgical History:  Procedure Laterality Date  . COLONOSCOPY    . HEMORROIDECTOMY    . HOLMIUM LASER APPLICATION N/A 3/61/4431   Procedure:  HOLMIUM LASER APPLICATION;  Surgeon: Royston Cowper, MD;  Location: [facility] ORS;  Service: Urology;  Laterality: N/A;  . INSERTION PROSTATE RADIATION SEED    . LITHOTRIPSY    . TRANSURETHRAL RESECTION OF BLADDER TUMOR WITH MITOMYCIN-C N/A 10/24/2018   Procedure: TRANSURETHRAL RESECTION OF BLADDER TUMOR WITH MITOMYCIN-C;  Surgeon: Royston Cowper, MD;  Location: [facility] ORS;  Service: Urology;  Laterality: N/A;  . TRANSURETHRAL RESECTION OF PROSTATE N/A 12/02/2015   Procedure: TRANSURETHRAL RESECTION OF THE PROSTATE (TURP);  Surgeon: Royston Cowper, MD;  Location: [facility] ORS;  Service: Urology;  Laterality: N/A;  . VASECTOMY      Prior to Admission medications   Medication Sig Start Date End Date Taking? Authorizing Provider  apixaban (ELIQUIS) 5 MG TABS tablet Take 2.5 mg by mouth 2 (two) times daily.     [provider]  bicalutamide (CASODEX) 50 MG tablet Take 1 tablet (50 mg total) by mouth daily. 04/01/18   Mayo, Pete Pelt, MD  folic acid (FOLVITE) 540 MCG tablet Take 800 mcg by mouth daily.    [provider]  furosemide (LASIX) 20 MG tablet Take 20 mg by mouth daily.  10/04/19   [provider]  meloxicam (MOBIC) 7.5 MG tablet Take 7.5 mg by mouth daily as needed for pain.     [provider]  metoprolol succinate (TOPROL-XL) 25 MG 24 hr tablet Take 0.5 tablets (  12.5 mg total) by mouth daily. 03/31/18   Mayo, Pete Pelt, MD  Multiple Vitamins-Minerals (CENTRUM SILVER 50+MEN PO) Take 1 tablet by mouth 3 (three) times a week.    [provider]  potassium chloride (KLOR-CON) 10 MEQ tablet Take 10 mEq by mouth daily.    [provider]  Probiotic Product (PROBIOTIC PO) Take 1 capsule by mouth 3 (three) times a week.    [provider]  simvastatin (ZOCOR) 40 MG tablet Take 40 mg by mouth every morning.     [provider]    Allergies Patient has no known allergies.  Family History  Problem Relation Age of Onset  . Heart  failure Sister        d. age 86    Social History Social History   Tobacco Use  . Smoking status: Former Smoker    Packs/day: 1.00    Years: 20.00    Pack years: 20.00    Types: Cigarettes    Quit date: 09/18/1978    Years since quitting: 42.0  . Smokeless tobacco: Never Used  Vaping Use  . Vaping Use: Never used  Substance Use Topics  . Alcohol use: Not Currently    Alcohol/week: 7.0 standard drinks    Types: 7 Cans of beer per week    Comment: 1 BEER DAY  . Drug use: No    Review of Systems  Review of Systems  Constitutional: Negative for chills and fever.  HENT: Negative for sore throat.   Eyes: Negative for pain.  Respiratory: Negative for cough and stridor.   Cardiovascular: Negative for chest pain.  Gastrointestinal: Negative for vomiting.  Genitourinary: Negative for dysuria.  Musculoskeletal: Positive for myalgias ( L hand).  Skin: Negative for rash.  Neurological: Positive for headaches. Negative for seizures and loss of consciousness.  Psychiatric/Behavioral: Negative for suicidal ideas.  All other systems reviewed and are negative.     ____________________________________________   PHYSICAL EXAM:  VITAL SIGNS: ED Triage Vitals  Enc Vitals Group     BP      Pulse      Resp      Temp      Temp src      SpO2      Weight      Height      Head Circumference      Peak Flow      Pain Score      Pain Loc      Pain Edu?      Excl. in [place]?    Vitals:   09/15/20 2040 09/15/20 2320  BP: (!) 160/63 (!) 148/62  Pulse: 94 88  Resp: 18 16  Temp: 98.1 F (36.7 C)   SpO2: 98% 98%   Physical Exam Vitals and nursing note reviewed.  Constitutional:      Appearance: He is well-developed.  HENT:     Head: Normocephalic.     Right Ear: External ear normal.     Left Ear: External ear normal.     Nose: Nose normal.  Eyes:     Conjunctiva/sclera: Conjunctivae normal.  Cardiovascular:     Rate and Rhythm: Normal rate.     Heart sounds: No murmur  heard.   Pulmonary:     Effort: Pulmonary effort is normal. No respiratory distress.     Breath sounds: Normal breath sounds.  Abdominal:     Palpations: Abdomen is soft.     Tenderness: There is no abdominal tenderness.  Musculoskeletal:     Cervical back: Neck supple.  Skin:    General: Skin is warm and dry.     Capillary Refill: Capillary refill takes less than 2 seconds.  Neurological:     Mental Status: He is alert and oriented to person, place, and time.  Psychiatric:        Mood and Affect: Mood normal.     Patient has approximately 2 cm fluctuant hematoma over the right eyebrow.  There is also approximately 0.5 cm linear superficial laceration over the left nose.  No septal hematoma or deformity.  Cranial nerves II through XII grossly intact.  No other trauma to the face scalp head or neck.  No tenderness step-offs or deformities over the C/T/L-spine.  Patient has approximately 4 cm linear laceration over the dorsum of his left hand.  Does not involve any of the digits across the wrist.  No other trauma to the hand.  He is able to flex and extend all digits against resistance.  2+ radial pulse.  Sensation intact in the distribution of the radial ulnar and median nerves.  Patient has symmetric grip strength in both hands and both legs have full power.  Sensation is intact to light touch to all extremities.  No other obvious trauma to the extremities chest abdomen or back.   ____________________________________________   LABS (all labs ordered are listed, but only abnormal results are displayed)  Labs Reviewed  BASIC METABOLIC PANEL - Abnormal; Notable for the following components:      Result Value   Sodium 134 (*)    Glucose, Bld 123 (*)    BUN 38 (*)    All other components within normal limits  CBC WITH DIFFERENTIAL/PLATELET  TYPE AND SCREEN  TYPE AND SCREEN    ____________________________________________  EKG  ____________________________________________  RADIOLOGY  ED MD interpretation: CT head, face and C-spine without evidence of acute orthopedic injury or intracranial hemorrhage but there is evidence of frontal hematoma seen on exam.  Plain film left hand shows no underlying fracture dislocation or retained foreign body.  Official radiology report(s): CT Head Wo Contrast  Result Date: 09/15/2020 CLINICAL DATA:  Slipped and fell, facial laceration EXAM: CT HEAD WITHOUT CONTRAST TECHNIQUE: Contiguous axial images were obtained from the base of the skull through the vertex without intravenous contrast. COMPARISON:  None. FINDINGS: Brain: No acute infarct or hemorrhage. Age-appropriate cerebral atrophy. Lateral ventricles and midline structures are unremarkable. No acute extra-axial fluid collections. No mass effect. Vascular: No hyperdense vessel or unexpected calcification. Skull: Large right supraorbital scalp hematoma. No underlying fracture. The remainder of the calvarium is unremarkable. Sinuses/Orbits: No acute finding. Other: None. IMPRESSION: 1. Right supraorbital scalp hematoma. 2. No acute intracranial process. Electronically Signed   By: Randa Ngo M.D.   On: 09/15/2020 22:06   CT Cervical Spine Wo Contrast  Result Date: 09/15/2020 CLINICAL DATA:  80 year old male with neck trauma. EXAM: CT CERVICAL SPINE WITHOUT CONTRAST TECHNIQUE: Multidetector CT imaging of the cervical spine was performed without intravenous contrast. Multiplanar CT image reconstructions were also generated. COMPARISON:  None. FINDINGS: Alignment: No acute subluxation. Skull base and vertebrae: No acute fracture. Osteopenia Soft tissues and spinal canal: No prevertebral fluid or swelling. No visible canal hematoma. Disc levels: Multilevel degenerative changes with endplate irregularity and disc space narrowing and spurring. Upper chest: Negative. Other: Bilateral  carotid bulb calcified plaques. IMPRESSION: 1. No acute/traumatic cervical spine pathology. 2. Multilevel degenerative changes. Electronically Signed   By: Laren Everts.D.  On: 09/15/2020 22:29   DG Hand Complete Left  Result Date: 09/15/2020 CLINICAL DATA:  80 year old male with fall. EXAM: LEFT HAND - COMPLETE 3+ VIEW COMPARISON:  None. FINDINGS: There is no acute fracture or dislocation. The bones are osteopenic. Degenerative changes of the PIP joints of the third-fifth digit. The soft tissues are unremarkable. IMPRESSION: No acute fracture or dislocation. Electronically Signed   By: Anner Crete M.D.   On: 09/15/2020 21:14   CT Maxillofacial Wo Contrast  Result Date: 09/15/2020 CLINICAL DATA:  Slipped and fell, facial laceration EXAM: CT MAXILLOFACIAL WITHOUT CONTRAST TECHNIQUE: Multidetector CT imaging of the maxillofacial structures was performed. Multiplanar CT image reconstructions were also generated. COMPARISON:  None. FINDINGS: Osseous: No fracture or mandibular dislocation. No destructive process. Orbits: Negative. No traumatic or inflammatory finding. Sinuses: The maxillary sinuses are hypoplastic. Polypoid mucosal thickening versus mucous retention cyst within the left maxillary sinus. Remaining sinuses are clear. Soft tissues: There is a large right supraorbital scalp hematoma. Mild left supraorbital soft tissue swelling. There is soft tissue swelling overlying the nasal bridge on the left. Limited intracranial: No significant or unexpected finding. IMPRESSION: 1. No acute facial bone fracture. 2. Large right supraorbital scalp hematoma. Minimal soft tissue swelling of the nasal bridge and left supraorbital region. Electronically Signed   By: Randa Ngo M.D.   On: 09/15/2020 22:10    ____________________________________________   PROCEDURES  Procedure(s) performed (including Critical Care):  Marland KitchenMarland KitchenLaceration Repair  Date/Time: 09/16/2020 12:11 AM Performed by: Lucrezia Starch, MD Authorized by: Lucrezia Starch, MD   Consent:    Consent obtained:  Verbal   Consent given by:  Patient   Risks, benefits, and alternatives were discussed: yes     Risks discussed:  Pain, infection and need for additional repair   Alternatives discussed:  No treatment Universal protocol:    Procedure explained and questions answered to patient or proxy's satisfaction: yes     Patient identity confirmed:  Verbally with patient Laceration details:    Location:  Hand   Length (cm):  4 Exploration:    Limited defect created (wound extended): no     Hemostasis achieved with:  Direct pressure   Imaging obtained: x-ray     Imaging outcome: foreign body not noted     Wound exploration: wound explored through full range of motion     Contaminated: no   Treatment:    Area cleansed with:  Saline   Amount of cleaning:  Extensive   Irrigation solution:  Sterile saline   Irrigation method:  Syringe   Debridement:  None   Scar revision: no   Skin repair:    Repair method:  Sutures   Suture size:  4-0   Suture material:  Prolene   Suture technique:  Simple interrupted   Number of sutures:  5 Approximation:    Approximation:  Loose Repair type:    Repair type:  Simple Post-procedure details:    Dressing:  Open (no dressing)   Procedure completion:  Tolerated well, no immediate complications     ____________________________________________   INITIAL IMPRESSION / ASSESSMENT AND PLAN / ED COURSE      Patient presents with above to history exam for assessment of hematoma over the right eye and a small cut to his left nose and over the dorsum of his left hand he sustained after mechanical ground-level fall immediately prior to arrival.  On arrival he is afebrile hemodynamically stable.  He is otherwise neurovascular intact in  all extremities without any other evidence of significant trauma on exam.  However given he is on Eliquis will obtain basic labs including CBC in  addition to CT head and neck obtain plain film of the hand.  CT head, face and C-spine without evidence of acute orthopedic injury or intracranial hemorrhage but there is evidence of frontal hematoma seen on exam.  Plain film left hand shows no underlying fracture dislocation or retained foreign body.  Tetanus updated.  BMP unremarkable.  CBC with WBC count of 7.1, hemoglobin 11.4 and platelets of 199.  Given stable vitals with eyes reassuring exam and will suspicion for other significant occult or visceral injury and no evidence of intracranial hemorrhage or other orthopedic injury on CTs I think patient is safe for discharge with close outpatient PCP follow-up.  Discharged stable condition peer strict return precautions advised and discussed.  ____________________________________________   FINAL CLINICAL IMPRESSION(S) / ED DIAGNOSES  Final diagnoses:  Traumatic hematoma of forehead, initial encounter  Laceration of nose, initial encounter  Laceration of left hand without foreign body, initial encounter  Anticoagulated    Medications  Tdap (BOOSTRIX) injection 0.5 mL (0.5 mLs Intramuscular Given 09/15/20 2324)  lidocaine-EPINEPHrine (XYLOCAINE W/EPI) 2 %-1:100000 (with pres) injection 30 mL (30 mLs Intradermal Given by Other 09/15/20 2323)  acetaminophen (TYLENOL) tablet 1,000 mg (1,000 mg Oral Given 09/15/20 2321)     ED Discharge Orders    None       Note:  This document was prepared using Dragon voice recognition software and may include unintentional dictation errors.   Lucrezia Starch, MD 09/16/20 [phone]

## 2020-09-16 LAB — CBC WITH DIFFERENTIAL/PLATELET
Abs Immature Granulocytes: 0.03 10*3/uL (ref 0.00–0.07)
Basophils Absolute: 0.1 10*3/uL (ref 0.0–0.1)
Basophils Relative: 1 %
Eosinophils Absolute: 0.2 10*3/uL (ref 0.0–0.5)
Eosinophils Relative: 2 %
HCT: 35.6 % — ABNORMAL LOW (ref 39.0–52.0)
Hemoglobin: 11.4 g/dL — ABNORMAL LOW (ref 13.0–17.0)
Immature Granulocytes: 0 %
Lymphocytes Relative: 30 %
Lymphs Abs: 2.1 10*3/uL (ref 0.7–4.0)
MCH: 21.2 pg — ABNORMAL LOW (ref 26.0–34.0)
MCHC: 32 g/dL (ref 30.0–36.0)
MCV: 66.3 fL — ABNORMAL LOW (ref 80.0–100.0)
Monocytes Absolute: 0.5 10*3/uL (ref 0.1–1.0)
Monocytes Relative: 7 %
Neutro Abs: 4.2 10*3/uL (ref 1.7–7.7)
Neutrophils Relative %: 60 %
Platelets: 199 10*3/uL (ref 150–400)
RBC: 5.37 MIL/uL (ref 4.22–5.81)
RDW: 16.1 % — ABNORMAL HIGH (ref 11.5–15.5)
WBC: 7.1 10*3/uL (ref 4.0–10.5)
nRBC: 0 % (ref 0.0–0.2)

## 2020-09-16 IMAGING — CT CT ABDOMEN AND PELVIS WITHOUT AND WITH CONTRAST
3 of 12 series · 12 of 46 positions shown, 18 images · IV contrast (omnipaque)
Comparison: 06/21/2017 PET-CT

CLINICAL DATA: Gross hematuria 2 months ago and 2 weeks ago.
History of prostate cancer.

EXAM:
CT ABDOMEN AND PELVIS WITHOUT AND WITH CONTRAST
TECHNIQUE: Multidetector CT imaging of the abdomen and pelvis was performed
following the standard protocol before and following the bolus
administration of intravenous contrast.
CONTRAST:  125mL OMNIPAQUE IOHEXOL 300 MG/ML  SOLN

[Series 2: axial without pre · axial · non-contrast · 0.72mm/px · z∈[-1476,-1176]mm · 6 of 85 slices shown, 11 images]
[im 13/85  soft-tissue]
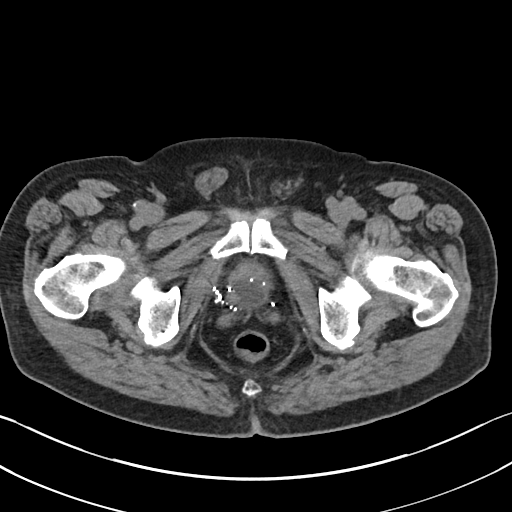
[im 13/85  bone]
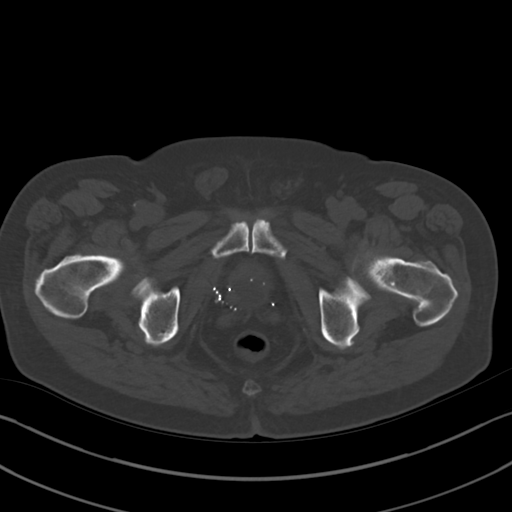
[im 25/85  soft-tissue]
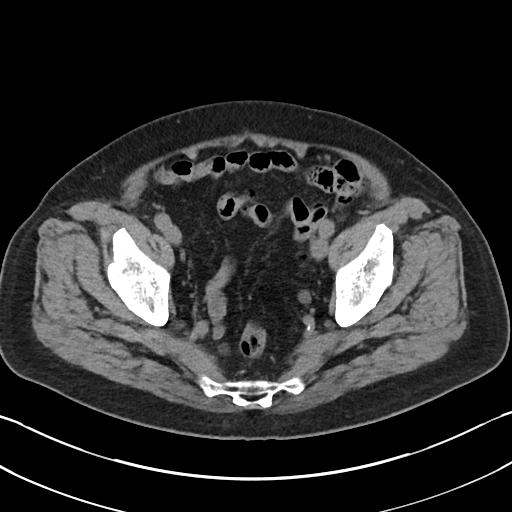
[im 37/85  soft-tissue]
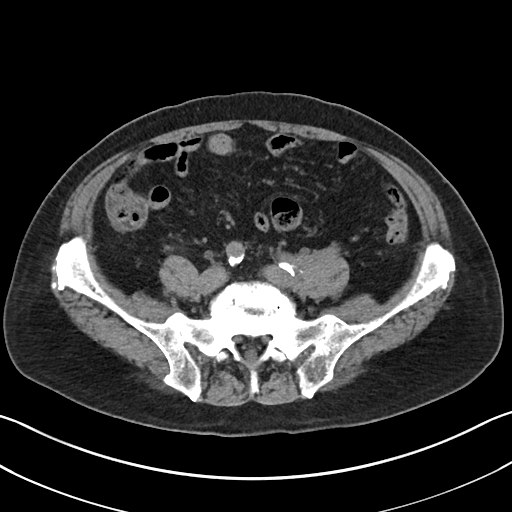
[im 37/85  lung]
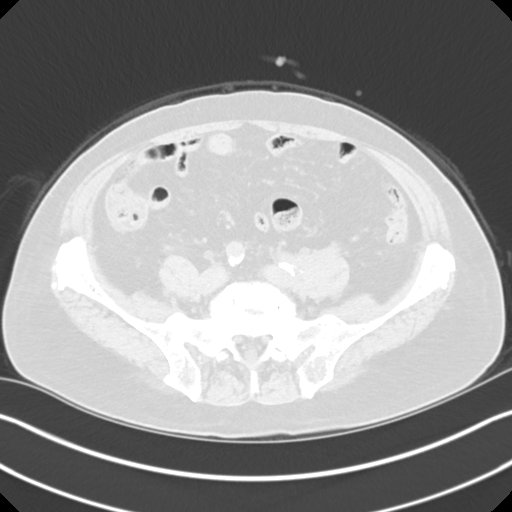
[im 49/85  soft-tissue]
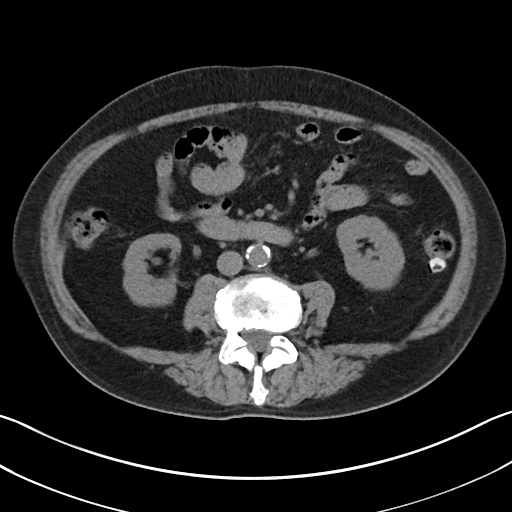
[im 49/85  lung]
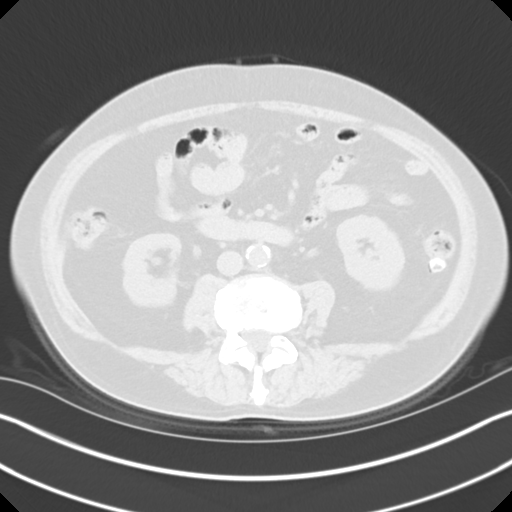
[im 61/85  soft-tissue]
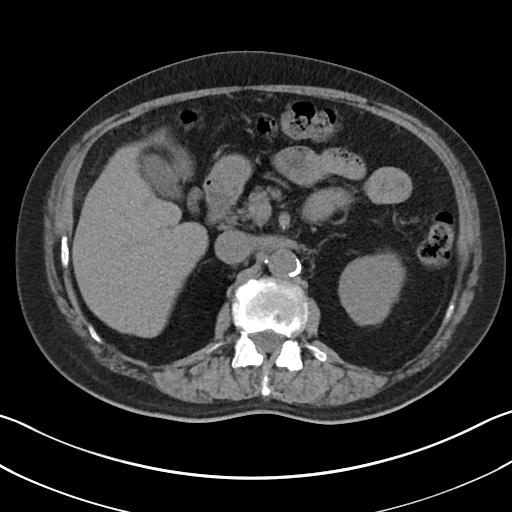
[im 61/85  lung]
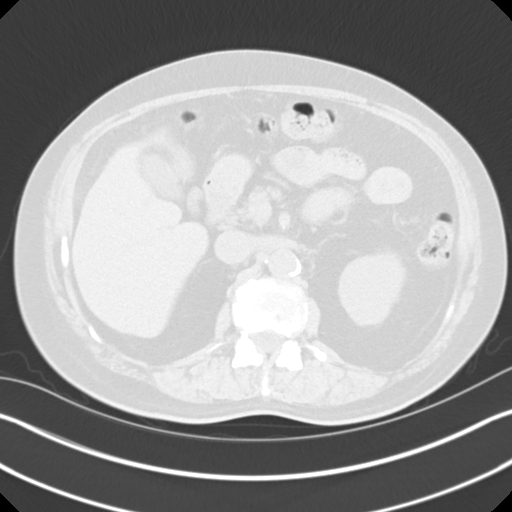
[im 73/85  soft-tissue]
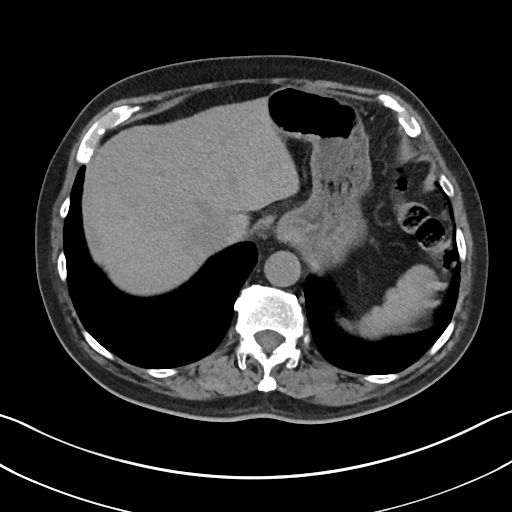
[im 73/85  lung]
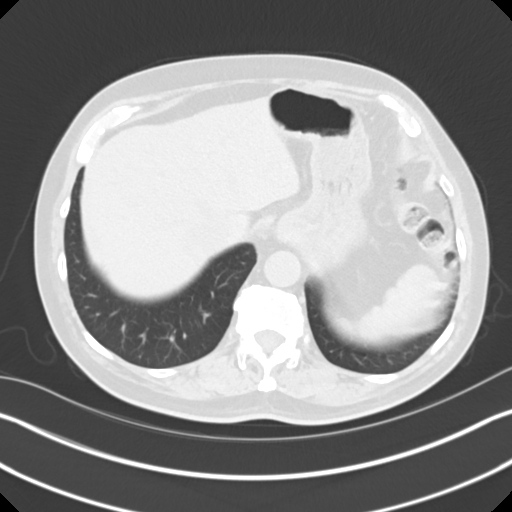

[Series 5: coronal without pre · coronal · non-contrast · 0.72mm/px · 2 of 142 slices shown, 3 images]
[im 48/142  soft-tissue]
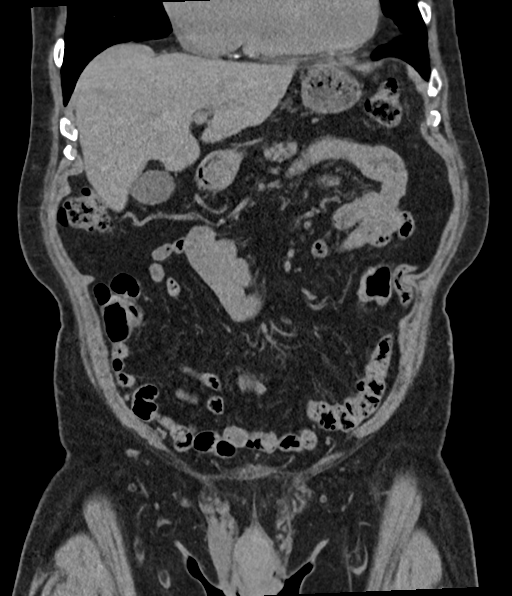
[im 48/142  bone]
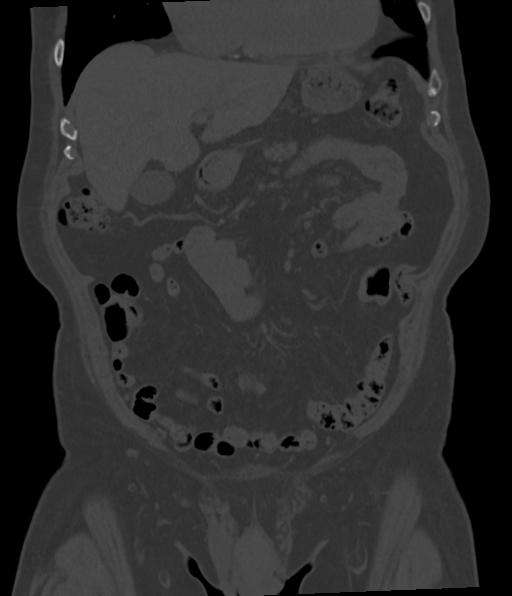
[im 95/142  soft-tissue]
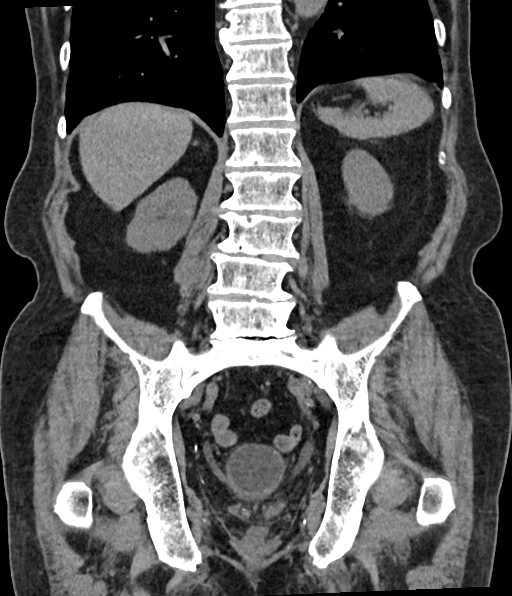

[Series 9: axial hematuria with · axial · 0.72mm/px · z∈[-1476,-1296]mm · 4 of 85 slices shown]
[im 13/85  soft-tissue]
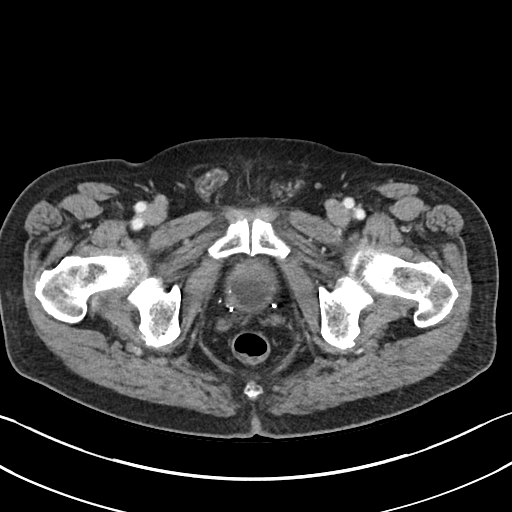
[im 25/85  soft-tissue]
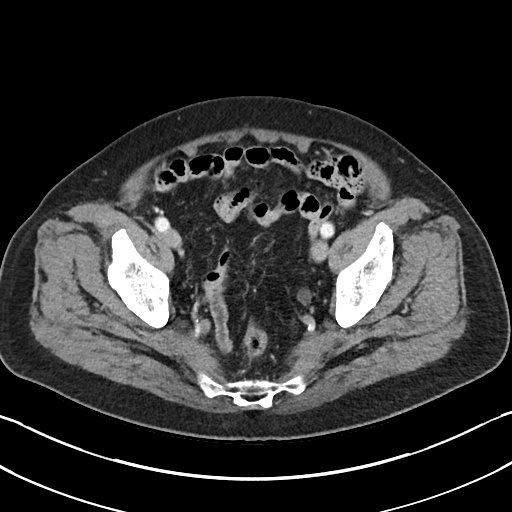
[im 37/85  soft-tissue]
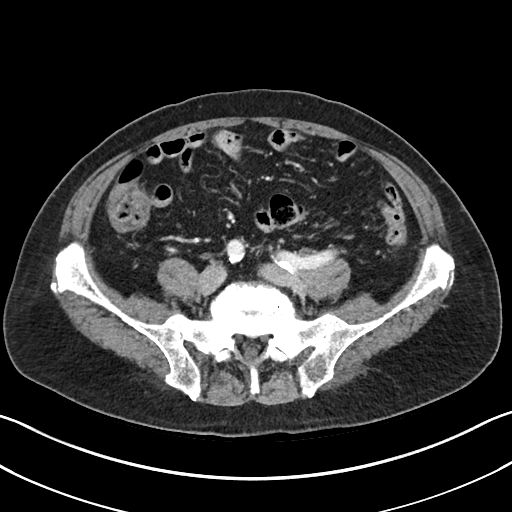
[im 49/85  soft-tissue]
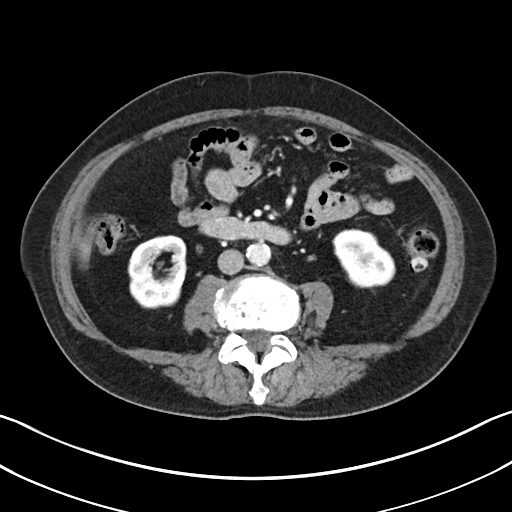

[12 of 46 positions shown; findings below may reference images not displayed]

FINDINGS: Lower chest: Mild cardiomegaly. Descending thoracic aortic
atherosclerotic calcification.

Hepatobiliary: Unremarkable

Pancreas: Unremarkable

Spleen: Unremarkable

Adrenals/Urinary Tract: Both adrenal glands appear normal.

No renal calculi are observed. 3.4 by 3.2 cm exophytic right kidney
lower pole Bosniak category 2 cyst is noted with a faint internal
septation. They were 2 right and 3 left tiny hypodense lesions which
are technically too small to characterize although statistically
likely to be cysts. No abnormal filling defect or abnormal
enhancement along the urothelium.

Stomach/Bowel: Unremarkable

Vascular/Lymphatic: Aortoiliac atherosclerotic vascular disease. No
pathologic adenopathy.

Reproductive: Brachytherapy seed implants along the prostate with a
large transurethral resection of the prostate defect, and only a
small amount of residual prostate tissue.

Other: No supplemental non-categorized findings.

Musculoskeletal: Partial fusion of the sacroiliac joints. Lumbar
spondylosis and degenerative disc disease.
IMPRESSION: 1. No cause for hematuria is identified. There is a 3.4 cm exophytic
right kidney lower pole Bosniak category 2 cyst, and several small
hypodense lesions in the kidneys which are technically too small to
characterize although statistically likely to be cysts.
2. Mild cardiomegaly.
3.  Aortic Atherosclerosis ([id number]).

## 2020-09-16 NOTE — ED Notes (Signed)
Dressing applied to left hand

## 2020-09-16 NOTE — ED Notes (Signed)
Patient discharged to home per MD order. Patient in stable condition, and deemed medically cleared by ED provider for discharge. Discharge instructions reviewed with patient/family using "Teach Back"; verbalized understanding of medication education and administration, and information about follow-up care. Denies further concerns. ° °
